# Patient Record
Sex: Female | Born: 1975 | Race: White | Hispanic: No | Smoking: Never smoker
Health system: Southern US, Community
[De-identification: ages and names within clinical notes are randomized; demographics above are authoritative.]

## PROBLEM LIST (undated history)

## (undated) DIAGNOSIS — E785 Hyperlipidemia, unspecified: Secondary | ICD-10-CM

## (undated) DIAGNOSIS — C801 Malignant (primary) neoplasm, unspecified: Secondary | ICD-10-CM

## (undated) DIAGNOSIS — I10 Essential (primary) hypertension: Secondary | ICD-10-CM

## (undated) HISTORY — PX: CERVIX SURGERY: SHX593

## (undated) HISTORY — DX: Hyperlipidemia, unspecified: E78.5

---

## 2020-08-16 ENCOUNTER — Inpatient Hospital Stay (HOSPITAL_COMMUNITY): Payer: Self-pay

## 2020-08-16 ENCOUNTER — Other Ambulatory Visit: Payer: Self-pay

## 2020-08-16 ENCOUNTER — Inpatient Hospital Stay (HOSPITAL_COMMUNITY)
Admission: EM | Admit: 2020-08-16 | Discharge: 2020-08-19 | DRG: 304 | Disposition: A | Discharge status: Home / Self Care | Payer: Self-pay | Attending: Internal Medicine | Admitting: Internal Medicine

## 2020-08-16 ENCOUNTER — Emergency Department (HOSPITAL_COMMUNITY): Payer: Self-pay

## 2020-08-16 ENCOUNTER — Encounter (HOSPITAL_COMMUNITY): Payer: Self-pay

## 2020-08-16 DIAGNOSIS — E876 Hypokalemia: Secondary | ICD-10-CM | POA: Diagnosis present

## 2020-08-16 DIAGNOSIS — Z6841 Body Mass Index (BMI) 40.0 and over, adult: Secondary | ICD-10-CM

## 2020-08-16 DIAGNOSIS — Z597 Insufficient social insurance and welfare support: Secondary | ICD-10-CM

## 2020-08-16 DIAGNOSIS — J45909 Unspecified asthma, uncomplicated: Secondary | ICD-10-CM | POA: Diagnosis present

## 2020-08-16 DIAGNOSIS — I110 Hypertensive heart disease with heart failure: Secondary | ICD-10-CM | POA: Diagnosis present

## 2020-08-16 DIAGNOSIS — Z20822 Contact with and (suspected) exposure to covid-19: Secondary | ICD-10-CM | POA: Diagnosis present

## 2020-08-16 DIAGNOSIS — I161 Hypertensive emergency: Secondary | ICD-10-CM

## 2020-08-16 DIAGNOSIS — Z79899 Other long term (current) drug therapy: Secondary | ICD-10-CM

## 2020-08-16 DIAGNOSIS — I160 Hypertensive urgency: Principal | ICD-10-CM | POA: Diagnosis present

## 2020-08-16 DIAGNOSIS — F419 Anxiety disorder, unspecified: Secondary | ICD-10-CM | POA: Diagnosis present

## 2020-08-16 DIAGNOSIS — R7989 Other specified abnormal findings of blood chemistry: Secondary | ICD-10-CM

## 2020-08-16 DIAGNOSIS — I5041 Acute combined systolic (congestive) and diastolic (congestive) heart failure: Secondary | ICD-10-CM | POA: Diagnosis present

## 2020-08-16 DIAGNOSIS — I429 Cardiomyopathy, unspecified: Secondary | ICD-10-CM

## 2020-08-16 DIAGNOSIS — I313 Pericardial effusion (noninflammatory): Secondary | ICD-10-CM | POA: Diagnosis present

## 2020-08-16 DIAGNOSIS — I3139 Other pericardial effusion (noninflammatory): Secondary | ICD-10-CM

## 2020-08-16 DIAGNOSIS — J189 Pneumonia, unspecified organism: Secondary | ICD-10-CM

## 2020-08-16 DIAGNOSIS — G43909 Migraine, unspecified, not intractable, without status migrainosus: Secondary | ICD-10-CM | POA: Diagnosis present

## 2020-08-16 DIAGNOSIS — R778 Other specified abnormalities of plasma proteins: Secondary | ICD-10-CM

## 2020-08-16 DIAGNOSIS — R0602 Shortness of breath: Secondary | ICD-10-CM

## 2020-08-16 DIAGNOSIS — R809 Proteinuria, unspecified: Secondary | ICD-10-CM | POA: Diagnosis present

## 2020-08-16 DIAGNOSIS — E785 Hyperlipidemia, unspecified: Secondary | ICD-10-CM | POA: Diagnosis present

## 2020-08-16 LAB — COMPREHENSIVE METABOLIC PANEL
ALT: 21 U/L (ref 0–44)
AST: 25 U/L (ref 15–41)
Albumin: 4.3 g/dL (ref 3.5–5.0)
Alkaline Phosphatase: 65 U/L (ref 38–126)
Anion gap: 12 (ref 5–15)
BUN: 14 mg/dL (ref 6–20)
CO2: 22 mmol/L (ref 22–32)
Calcium: 9.1 mg/dL (ref 8.9–10.3)
Chloride: 106 mmol/L (ref 98–111)
Creatinine, Ser: 1.11 mg/dL — ABNORMAL HIGH (ref 0.44–1.00)
GFR, Estimated: >60 mL/min (ref >60)
Glucose, Bld: 110 mg/dL — ABNORMAL HIGH (ref 70–99)
Potassium: 3.9 mmol/L (ref 3.5–5.1)
Sodium: 140 mmol/L (ref 135–145)
Total Bilirubin: 0.5 mg/dL (ref 0.3–1.2)
Total Protein: 7.8 g/dL (ref 6.5–8.1)

## 2020-08-16 LAB — CBC WITH DIFFERENTIAL/PLATELET
Abs Immature Granulocytes: 0.08 10*3/uL — ABNORMAL HIGH (ref 0.00–0.07)
Basophils Absolute: 0.1 10*3/uL (ref 0.0–0.1)
Basophils Relative: 1 %
Eosinophils Absolute: 0.2 10*3/uL (ref 0.0–0.5)
Eosinophils Relative: 1 %
HCT: 44.3 % (ref 36.0–46.0)
Hemoglobin: 14.5 g/dL (ref 12.0–15.0)
Immature Granulocytes: 1 %
Lymphocytes Relative: 9 %
Lymphs Abs: 1.4 10*3/uL (ref 0.7–4.0)
MCH: 28.6 pg (ref 26.0–34.0)
MCHC: 32.7 g/dL (ref 30.0–36.0)
MCV: 87.4 fL (ref 80.0–100.0)
Monocytes Absolute: 0.6 10*3/uL (ref 0.1–1.0)
Monocytes Relative: 4 %
Neutro Abs: 12.8 10*3/uL — ABNORMAL HIGH (ref 1.7–7.7)
Neutrophils Relative %: 84 %
Platelets: 259 10*3/uL (ref 150–400)
RBC: 5.07 MIL/uL (ref 3.87–5.11)
RDW: 14.4 % (ref 11.5–15.5)
WBC: 15.1 10*3/uL — ABNORMAL HIGH (ref 4.0–10.5)
nRBC: 0 % (ref 0.0–0.2)

## 2020-08-16 LAB — URINALYSIS, ROUTINE W REFLEX MICROSCOPIC
Bilirubin Urine: NEGATIVE
Glucose, UA: 50 mg/dL — AB
Ketones, ur: NEGATIVE mg/dL
Leukocytes,Ua: NEGATIVE
Nitrite: NEGATIVE
Protein, ur: 30 mg/dL — AB
RBC / HPF: >50 RBC/hpf — ABNORMAL HIGH (ref 0–5)
Specific Gravity, Urine: 1.005 (ref 1.005–1.030)
pH: 7 (ref 5.0–8.0)

## 2020-08-16 LAB — TROPONIN I (HIGH SENSITIVITY)
Troponin I (High Sensitivity): 33 ng/L — ABNORMAL HIGH (ref <18)
Troponin I (High Sensitivity): 28 ng/L — ABNORMAL HIGH (ref <18)

## 2020-08-16 LAB — PROCALCITONIN: Procalcitonin: <0.1 ng/mL

## 2020-08-16 LAB — RESP PANEL BY RT-PCR (FLU A&B, COVID) ARPGX2
Influenza A by PCR: NEGATIVE
Influenza B by PCR: NEGATIVE
SARS Coronavirus 2 by RT PCR: NEGATIVE

## 2020-08-16 LAB — HIV ANTIBODY (ROUTINE TESTING W REFLEX): HIV Screen 4th Generation wRfx: NONREACTIVE

## 2020-08-16 LAB — D-DIMER, QUANTITATIVE: D-Dimer, Quant: 0.64 ug/mL-FEU — ABNORMAL HIGH (ref 0.00–0.50)

## 2020-08-16 LAB — BRAIN NATRIURETIC PEPTIDE: B Natriuretic Peptide: 252.4 pg/mL — ABNORMAL HIGH (ref 0.0–100.0)

## 2020-08-16 MED ORDER — ALBUTEROL SULFATE HFA 108 (90 BASE) MCG/ACT IN AERS
8.0000 | INHALATION_SPRAY | Freq: Once | RESPIRATORY_TRACT | Status: AC
  Administered 2020-08-16: 07:00:00 8 via RESPIRATORY_TRACT
  Filled 2020-08-16: qty 6.7

## 2020-08-16 MED ORDER — ONDANSETRON HCL 4 MG/2ML IJ SOLN
4.0000 mg | Freq: Four times a day (QID) | INTRAMUSCULAR | Status: DC | PRN
  Administered 2020-08-16: 19:00:00 4 mg via INTRAVENOUS
  Filled 2020-08-16: qty 2

## 2020-08-16 MED ORDER — LOSARTAN POTASSIUM 50 MG PO TABS
50.0000 mg | ORAL_TABLET | Freq: Every day | ORAL | Status: DC
  Administered 2020-08-16: 11:00:00 50 mg via ORAL
  Filled 2020-08-16: qty 2

## 2020-08-16 MED ORDER — ALPRAZOLAM 0.5 MG PO TABS
0.5000 mg | ORAL_TABLET | Freq: Two times a day (BID) | ORAL | Status: DC | PRN
  Administered 2020-08-16 – 2020-08-17 (×3): 0.5 mg via ORAL
  Filled 2020-08-16 (×3): qty 1

## 2020-08-16 MED ORDER — LABETALOL HCL 5 MG/ML IV SOLN
10.0000 mg | INTRAVENOUS | Status: AC | PRN
  Administered 2020-08-16 (×3): 10 mg via INTRAVENOUS
  Filled 2020-08-16 (×2): qty 4

## 2020-08-16 MED ORDER — METOPROLOL TARTRATE 5 MG/5ML IV SOLN
5.0000 mg | Freq: Once | INTRAVENOUS | Status: AC
  Administered 2020-08-16: 15:00:00 5 mg via INTRAVENOUS
  Filled 2020-08-16: qty 5

## 2020-08-16 MED ORDER — ACETAMINOPHEN 650 MG RE SUPP: 650.0000 mg | Freq: Four times a day (QID) | RECTAL | Status: DC | PRN

## 2020-08-16 MED ORDER — AZITHROMYCIN 250 MG PO TABS
500.0000 mg | ORAL_TABLET | Freq: Every day | ORAL | Status: DC
  Administered 2020-08-16 – 2020-08-19 (×4): 500 mg via ORAL
  Filled 2020-08-16 (×4): qty 2

## 2020-08-16 MED ORDER — LORAZEPAM 2 MG/ML IJ SOLN
1.0000 mg | Freq: Once | INTRAMUSCULAR | Status: AC
  Administered 2020-08-16: 13:00:00 1 mg via INTRAVENOUS
  Filled 2020-08-16: qty 1

## 2020-08-16 MED ORDER — LOSARTAN POTASSIUM 50 MG PO TABS: 100.0000 mg | ORAL_TABLET | Freq: Every day | ORAL | Status: DC

## 2020-08-16 MED ORDER — ACETAMINOPHEN 325 MG PO TABS
650.0000 mg | ORAL_TABLET | Freq: Four times a day (QID) | ORAL | Status: DC | PRN
  Administered 2020-08-16 – 2020-08-17 (×3): 650 mg via ORAL
  Filled 2020-08-16 (×3): qty 2

## 2020-08-16 MED ORDER — HYDRALAZINE HCL 20 MG/ML IJ SOLN
10.0000 mg | Freq: Once | INTRAMUSCULAR | Status: AC
  Administered 2020-08-16: 11:00:00 10 mg via INTRAVENOUS
  Filled 2020-08-16: qty 1

## 2020-08-16 MED ORDER — METOPROLOL TARTRATE 25 MG PO TABS
25.0000 mg | ORAL_TABLET | Freq: Two times a day (BID) | ORAL | Status: DC
  Administered 2020-08-16: 13:00:00 25 mg via ORAL
  Filled 2020-08-16 (×2): qty 1

## 2020-08-16 MED ORDER — HYDRALAZINE HCL 50 MG PO TABS
50.0000 mg | ORAL_TABLET | Freq: Three times a day (TID) | ORAL | Status: DC
  Administered 2020-08-16 – 2020-08-17 (×4): 50 mg via ORAL
  Filled 2020-08-16 (×5): qty 1

## 2020-08-16 MED ORDER — LORAZEPAM 2 MG/ML IJ SOLN
0.5000 mg | Freq: Once | INTRAMUSCULAR | Status: AC
  Administered 2020-08-16: 07:00:00 0.5 mg via INTRAVENOUS
  Filled 2020-08-16: qty 1

## 2020-08-16 MED ORDER — LOSARTAN POTASSIUM 50 MG PO TABS
50.0000 mg | ORAL_TABLET | Freq: Every day | ORAL | Status: DC
  Administered 2020-08-17 – 2020-08-19 (×3): 50 mg via ORAL
  Filled 2020-08-16 (×3): qty 1

## 2020-08-16 MED ORDER — HYDRALAZINE HCL 20 MG/ML IJ SOLN
10.0000 mg | Freq: Three times a day (TID) | INTRAMUSCULAR | Status: DC | PRN
  Administered 2020-08-17 – 2020-08-18 (×2): 10 mg via INTRAVENOUS
  Filled 2020-08-16 (×2): qty 1

## 2020-08-16 MED ORDER — LOSARTAN POTASSIUM 50 MG PO TABS: 50.0000 mg | ORAL_TABLET | Freq: Once | ORAL | Status: DC

## 2020-08-16 MED ORDER — ONDANSETRON HCL 4 MG PO TABS: 4.0000 mg | ORAL_TABLET | Freq: Four times a day (QID) | ORAL | Status: DC | PRN

## 2020-08-16 MED ORDER — CEFTRIAXONE SODIUM 2 G IJ SOLR
2.0000 g | INTRAMUSCULAR | Status: DC
  Administered 2020-08-16 – 2020-08-17 (×2): 2 g via INTRAVENOUS
  Filled 2020-08-16: qty 20
  Filled 2020-08-16 (×2): qty 2

## 2020-08-16 MED ORDER — IOHEXOL 350 MG/ML SOLN
100.0000 mL | Freq: Once | INTRAVENOUS | Status: AC | PRN
  Administered 2020-08-16: 09:00:00 100 mL via INTRAVENOUS

## 2020-08-16 MED ORDER — PROCHLORPERAZINE EDISYLATE 10 MG/2ML IJ SOLN
10.0000 mg | Freq: Four times a day (QID) | INTRAMUSCULAR | Status: DC | PRN
  Administered 2020-08-16 – 2020-08-18 (×2): 10 mg via INTRAVENOUS
  Filled 2020-08-16 (×3): qty 2

## 2020-08-16 MED ORDER — METOPROLOL TARTRATE 50 MG PO TABS
50.0000 mg | ORAL_TABLET | Freq: Two times a day (BID) | ORAL | Status: DC
  Administered 2020-08-16 – 2020-08-17 (×3): 50 mg via ORAL
  Filled 2020-08-16 (×3): qty 1

## 2020-08-16 NOTE — ED Provider Notes (Signed)
Patient presents with cc of SOB.  Per previous provider note   "Patient without significant medical history, denies regular use of any daily medications, presents with onset of severe SOB that started 24 hours prior to arrival. No chest pain. She woke up early am yesterday morning with a feeling that she could not catch her breath and coughing. She reports her cough is sometimes persistent enough to cause her to gag but has had no vomiting. No known sick exposures. She reports remote use Albuterol "but not in years". She reports being COVID vaccinated. "    ? Pulmonary edema, no known hx of hypertension     Recheck on breathing, tests and imaging.   8:47 AM BP (!) 218/136   Pulse 100   Temp 98.3 F (36.8 C) (Oral)   Resp (!) 24   Ht 5\' 2"  (1.575 m)   Wt 97.5 kg   LMP  (LMP Unknown)   SpO2 99%   BMI 39.32 kg/m  I have reviewed the patient's labs.  CMP shows mildly elevated glucose and slightly elevated creatinine without previous baseline.  D-dimer is slightly elevated above normal.  CBC shows elevated white blood cell count at 15.1 without other abnormality.   Patient's Covid panel is negative, negative for flu.  She has an elevated troponin at 33.  Urinalysis shows large hemoglobin mild in the patient's urine.  She denies urinary symptoms.  On reevaluation of the patient I spoke with her and she states that she does not have 24 hours of symptom but awoke this morning with severe shortness of breath unlike anything she has had in the past.  She did take cold medication with decongestant this morning because she has had a dry cough that has been on and off for very long time and denies URI symptoms.  She denies any swelling in her lower extremities, orthopnea but feels severely short of breath and may have been having paroxysmal nocturnal dyspnea based on evaluation of the patient's images of her chest x-ray which I reviewed that show some likely mild interstitial edema.  Believe the  patient is currently having hypertensive emergency. I reviewed the patient's chart which shows that she was seen at [facility] for evaluation of headache in the emergency department on 07/08/2020 but eloped prior to being fully evaluated.  At that time her blood pressure was 225/151. I suppose the patient has been having hypertension for some time but is undiagnosed and does not follow regularly with medical care.  At this time we will try 3 doses of labetalol IV 10 mg every 10 minutes.  She is getting a CT angiogram of the chest to rule out pulmonary embolus as source of her shortness of breath.  We will reevaluate the patient's blood pressure but feel she will need admission.  Question whether she will need a drip for increased control of her blood pressure.  She denies headaches, neurologic symptoms or chest pain.   .Critical Care Performed by: 07/10/2020, PA-C Authorized by: Arthor Captain, PA-C   Critical care provider statement:    Critical care time (minutes):  50   Critical care time was exclusive of:  Separately billable procedures and treating other patients   Critical care was necessary to treat or prevent imminent or life-threatening deterioration of the following conditions: hypertensive emergency.   Critical care was time spent personally by me on the following activities:  Discussions with consultants, evaluation of patient's response to treatment, examination of patient, ordering and  performing treatments and interventions, ordering and review of laboratory studies, ordering and review of radiographic studies, pulse oximetry, re-evaluation of patient's condition, obtaining history from patient or surrogate and review of old charts   10:30 AM Case discussed with Dr. Marylyn Ishihara of [facility].  He will admit the patient for hypertensive emergency.  Patient has responded well to 3 doses of IV labetalol however he feels that it would be prudent to give her oral  medication has recommended Cozaar 50 mg which I have ordered.  Patient's second troponin is improved to some degree however the patient has no outpatient follow-up and has significant hypertension with endorgan damage requiring admission.  Patient is stable without chest pain shortness of breath or altered mentation throughout her visit in the ER.   Margarita Mail, PA-C 08/16/20 1031    Sherwood Gambler, MD 08/17/20 [phone]

## 2020-08-16 NOTE — ED Notes (Signed)
RN made aware of BP 

## 2020-08-16 NOTE — ED Triage Notes (Signed)
Pt states that she's had a cough for a few days but woke up this morning struggling to breathe

## 2020-08-16 NOTE — ED Notes (Signed)
Pt c/o intermittent cough, fever, and nasal congestion x 5 days.  Sts "I came in today because I stopped breathing while I was sleeping.  It woke me up."  Denies Hx of HTN.    While using BSC, Pt stated she has had urinary frequency and "fullness."

## 2020-08-16 NOTE — Progress Notes (Signed)
Pt given Metroprolol PO by ED RN-Dawn and pt arrive crying and coughing, stating my head hurts and I feel nauseated. Medicated with Xanax, tylendol, and Zofran, MD updated will recheck BP. SRP, RN

## 2020-08-16 NOTE — Progress Notes (Signed)
Pt also [place], protocol started. MD made aware and orders noted. SRP, RN

## 2020-08-16 NOTE — ED Provider Notes (Signed)
[facility] DEPT Provider Note   CSN: [id number] Arrival date & time: 08/16/20  0354     History No chief complaint on file.   Rebecca Donovan is a 44 y.o. female.  Patient without significant medical history, denies regular use of any daily medications, presents with onset of severe SOB that started 24 hours prior to arrival. No chest pain. She woke up early am yesterday morning with a feeling that she could not catch her breath and coughing. She reports her cough is sometimes persistent enough to cause her to gag but has had no vomiting. No known sick exposures. She reports remote use Albuterol "but not in years". She reports being COVID vaccinated.   The history is provided by the patient. No language interpreter was used.       History reviewed. No pertinent past medical history.  There are no problems to display for this patient.   History reviewed. No pertinent surgical history.   OB History   No obstetric history on file.     History reviewed. No pertinent family history.  Social History   Tobacco Use  . Smoking status: Never Smoker  . Smokeless tobacco: Never Used  Substance Use Topics  . Alcohol use: Never  . Drug use: Never    Home Medications Prior to Admission medications   Not on File    Allergies    Patient has no allergy information on record.  Review of Systems   Review of Systems  Constitutional: Positive for fever ("low grade"). Negative for chills.  HENT: Negative.   Respiratory: Positive for cough and shortness of breath.   Cardiovascular: Negative.  Negative for chest pain and leg swelling.  Gastrointestinal: Negative.  Negative for abdominal pain and vomiting.  Musculoskeletal: Negative.  Negative for myalgias.  Skin: Negative.   Neurological: Negative.     Physical Exam Updated Vital Signs BP (!) 198/140   Pulse (!) 103   Temp 98.3 F (36.8 C) (Oral)   Resp (!) 24   Ht 5\' 2"  (1.575 m)   Wt 97.5  kg   SpO2 96%   BMI 39.32 kg/m   Physical Exam Vitals and nursing note reviewed.  Constitutional:      Appearance: She is well-developed and well-nourished. She is obese.     Comments: Appears anxious.  HENT:     Head: Normocephalic.     Mouth/Throat:     Mouth: Mucous membranes are moist.  Cardiovascular:     Rate and Rhythm: Regular rhythm. Tachycardia present.     Heart sounds: No murmur heard.   Pulmonary:     Effort: Pulmonary effort is normal.     Breath sounds: Wheezing (R>L) present.  Abdominal:     General: Bowel sounds are normal.     Palpations: Abdomen is soft.     Tenderness: There is no abdominal tenderness. There is no guarding or rebound.  Musculoskeletal:        General: No swelling. Normal range of motion.     Cervical back: Normal range of motion and neck supple.     Right lower leg: No edema.     Left lower leg: No edema.  Skin:    General: Skin is warm and dry.     Findings: No rash.  Neurological:     Mental Status: She is alert and oriented to person, place, and time.  Psychiatric:        Mood and Affect: Mood and affect normal.  ED Results / Procedures / Treatments   Labs (all labs ordered are listed, but only abnormal results are displayed) Labs Reviewed  RESP PANEL BY RT-PCR (FLU A&B, COVID) [id number]  CBC WITH DIFFERENTIAL/PLATELET  COMPREHENSIVE METABOLIC PANEL  TROPONIN I (HIGH SENSITIVITY)    EKG EKG Interpretation  Date/Time:  Tuesday August 16 2020 04:15:19 EST Ventricular Rate:  109 PR Interval:    QRS Duration: 94 QT Interval:  357 QTC Calculation: 481 R Axis:   -35 Text Interpretation: Sinus tachycardia Left axis deviation Anterior infarct, old Abnormal T, consider ischemia, lateral leads 12 Lead; Mason-Likar Confirmed by Alona Bene [phone]) on 08/16/2020 4:58:25 AM   Radiology No results found.  Procedures Procedures (including critical care time)  Medications Ordered in ED Medications  albuterol (VENTOLIN  HFA) 108 (90 Base) MCG/ACT inhaler 8 puff (has no administration in time range)  LORazepam (ATIVAN) injection 0.5 mg (has no administration in time range)    ED Course  I have reviewed the triage vital signs and the nursing notes.  Pertinent labs & imaging results that were available during my care of the patient were reviewed by me and considered in my medical decision making (see chart for details).    MDM Rules/Calculators/A&P                          Patient to ED with SOB that woke her from sleep yesterday without chest pain. She reports low grade fever.   She is significantly hypertensive on arrival. She denies history of HTN. No chest pain. She is tachycardic, SOB of fairly sudden onset. D-dimer, troponin, EKG ordered but feel with symptoms of cough, wheezing, respiratory infection is more likely. She has been taking OTC decongestants, cough/cold formulas since yesterday which may contribute. She also appears significantly anxious. Ativan, Albuterol ordered.  Patient care signed out to Arthor Captain, [facility], for recheck and further management.   Final Clinical Impression(s) / ED Diagnoses Final diagnoses:  None   1. Dyspnea 2. Tachycardia 3. HTN  Rx / DC Orders ED Discharge Orders    None       Elpidio Anis, PA-C 08/16/20 0636    Maia Plan, MD 08/23/20 1209

## 2020-08-16 NOTE — H&P (Addendum)
History and Physical    Rebecca Donovan [id number] DOB: 1975-09-05 DOA: 08/16/2020  PCP: Patient, No Pcp Per  Patient coming from: Home  Chief Complaint: Dyspnea  HPI: Rebecca Donovan is a 44 y.o. female with medical history significant of HTN. Presenting with dyspnea. She reports that yesterday morning, she woke up with difficulty breathing. He chest was heavy. She thought it was asthma. She tried to take showers, some homeopathic oil, OTC decongestants/cold medicines, and a car drive to make things better. However, she got no relief. Her symptoms continued throughout the day into the night. She states that this morning, she stopped breathing while asleep. She became concerned and came to the ED. She denies any other aggravating or alleviating factors.     ED Course: CTA showed percardial effusion and possible multifocal PNA. No PE seen. Her BP was 228/129. She was given albuterol and labetalol She remained stable on RA (sats 100%). However, her BP was not controlled with labetolol. TRH was called for admission.   Review of Systems: Review of systems is otherwise negative for all not mentioned in HPI.   PMHx Asthma Migraines  PSHx ?Partial cervix removal?  SocHx  reports that she has never smoked. She has never used smokeless tobacco. She reports that she does not drink alcohol and does not use drugs.   FamHx History reviewed. No pertinent family history.  Prior to Admission medications   Not on File    Physical Exam: Vitals:   08/16/20 0919 08/16/20 0930 08/16/20 0936 08/16/20 1000  BP: (!) 199/131 (!) 199/124  (!) 198/136  Pulse: 91 90  87  Resp: (!) 22 (!) 23  (!) 22  Temp:   98.2 F (36.8 C)   TempSrc:      SpO2: 98% 99%  97%  Weight:      Height:        General: 44 y.o. female resting in bed, anxious Eyes: PERRL, normal sclera ENMT: Nares patent w/o discharge, orophaynx clear, dentition normal, ears w/o discharge/lesions/ulcers Neck: Supple, trachea  midline Cardiovascular: RRR, +S1, S2, no m/g/r, equal pulses throughout Respiratory: CTABL, no w/r/r, normal WOB on RA GI: BS+, NDNT, no masses noted, no organomegaly noted MSK: No e/c/c Skin: No rashes, bruises, ulcerations noted Neuro: A&O x 3, no focal deficits Psyc: Anxious, crying after CODE status questions, however she is cooperative  Labs on Admission: I have personally reviewed following labs and imaging studies  CBC: Recent Labs  Lab 08/16/20 0655  WBC 15.1*  NEUTROABS 12.8*  HGB 14.5  HCT 44.3  MCV 87.4  PLT 259   Basic Metabolic Panel: Recent Labs  Lab 08/16/20 0655  NA 140  K 3.9  CL 106  CO2 22  GLUCOSE 110*  BUN 14  CREATININE 1.11*  CALCIUM 9.1   GFR: Estimated Creatinine Clearance: 71.3 mL/min (A) (by C-G formula based on SCr of 1.11 mg/dL (H)). Liver Function Tests: Recent Labs  Lab 08/16/20 0655  AST 25  ALT 21  ALKPHOS 65  BILITOT 0.5  PROT 7.8  ALBUMIN 4.3   No results for input(s): LIPASE, AMYLASE in the last 168 hours. No results for input(s): AMMONIA in the last 168 hours. Coagulation Profile: No results for input(s): INR, PROTIME in the last 168 hours. Cardiac Enzymes: No results for input(s): CKTOTAL, CKMB, CKMBINDEX, TROPONINI in the last 168 hours. BNP (last 3 results) No results for input(s): PROBNP in the last 8760 hours. HbA1C: No results for input(s): HGBA1C in the last 72 hours.  CBG: No results for input(s): GLUCAP in the last 168 hours. Lipid Profile: No results for input(s): CHOL, HDL, LDLCALC, TRIG, CHOLHDL, LDLDIRECT in the last 72 hours. Thyroid Function Tests: No results for input(s): TSH, T4TOTAL, FREET4, T3FREE, THYROIDAB in the last 72 hours. Anemia Panel: No results for input(s): VITAMINB12, FOLATE, FERRITIN, TIBC, IRON, RETICCTPCT in the last 72 hours. Urine analysis:    Component Value Date/Time   COLORURINE STRAW (A) 08/16/2020 0655   APPEARANCEUR CLEAR 08/16/2020 0655   LABSPEC 1.005 08/16/2020 0655    PHURINE 7.0 08/16/2020 0655   GLUCOSEU 50 (A) 08/16/2020 0655   HGBUR LARGE (A) 08/16/2020 0655   BILIRUBINUR NEGATIVE 08/16/2020 0655   KETONESUR NEGATIVE 08/16/2020 0655   PROTEINUR 30 (A) 08/16/2020 0655   NITRITE NEGATIVE 08/16/2020 0655   LEUKOCYTESUR NEGATIVE 08/16/2020 0655    Radiological Exams on Admission: CT Angio Chest PE W and/or Wo Contrast  Result Date: 08/16/2020 CLINICAL DATA:  45 year old female with shortness of breath and fever. EXAM: CT ANGIOGRAPHY CHEST WITH CONTRAST TECHNIQUE: Multidetector CT imaging of the chest was performed using the standard protocol during bolus administration of intravenous contrast. Multiplanar CT image reconstructions and MIPs were obtained to evaluate the vascular anatomy. CONTRAST:  100 mL Omnipaque 350, intravenous COMPARISON:  None. FINDINGS: Cardiovascular: Satisfactory opacification of the pulmonary arteries to the segmental level. No evidence of pulmonary embolism. Moderate global cardiomegaly. Moderate pericardial effusion, measuring up to 1.4 cm along the lateral aspect of the right atrium. Mediastinum/Nodes: Scattered mediastinal prominent lymph nodes, for example paratracheal (CT series 5, image 27) which measures up to 1.3 cm in short axis. No hilar axillary lymph nodes. The thyroid gland, trachea, and esophagus demonstrate normal appearance. Lungs/Pleura: Fine parenchymal detail is limited by respiratory motion artifact. Mild diffuse mosaic attenuation pattern, likely secondary to expiratory phase image acquisition. Upper lobe predominant scattered bilateral ground-glass nodular opacities in a centrilobular distribution. No pleural effusion thorax. Upper Abdomen: The visualized upper abdomen is within normal limits. Musculoskeletal: No chest wall abnormality. No acute or significant osseous findings. Review of the MIP images confirms the above findings. IMPRESSION: Vascular: No evidence of pulmonary embolism. Non-Vascular: 1. Upper lobe  predominant diffuse ground-glass opacities in a centrilobular distribution, favored represent multifocal pneumonia in the setting of fever and cough. This is atypical presentation for multifocal pneumonia associated with COVID-19, however plausible. Scattered prominent mediastinal lymph nodes, likely reactive in the setting of infectious/inflammatory etiology. 2. Moderate pericardial effusion measuring up to 1.4 cm in thickness. 3. Moderate global cardiomegaly. Marliss Coots, MD [facility] Radiology Electronically Signed   By: Marliss Coots MD   On: 08/16/2020 09:33   DG Chest Portable 1 View  Result Date: 08/16/2020 CLINICAL DATA:  Shortness of breath.  Fever. EXAM: PORTABLE CHEST 1 VIEW COMPARISON:  No prior. FINDINGS: Mediastinum and hilar structures normal. Cardiomegaly. Low lung volumes with mild bibasilar atelectasis. Mild bilateral interstitial prominence. Mild interstitial edema and/or pneumonitis cannot be excluded. Mild elevation left hemidiaphragm. No prominent pleural effusion. No pneumothorax. IMPRESSION: 1. Cardiomegaly. 2. Low lung volumes with mild bibasilar atelectasis. Mild bilateral interstitial prominence. Mild interstitial edema and/or pneumonitis cannot be excluded. Electronically Signed   By: Maisie Fus  Register   On: 08/16/2020 06:33    EKG: Independently reviewed. Sinus tach, no st elevation  Assessment/Plan HTN urgency     - admit to inpatient, tele     - cozaar 50 mg, metoprolol 50mg  BID, schedule hydralazine 50 mg TID, PRN hydralazine     -  checking echo     - Her MAP at admission was 162, a 25% reduction would be about 120 (a BP of 180/90). This will be our goal for the first 24hrs.      - UA shows proteinuria, renal function is ok     - try to keep d/c meds on $4 list as patient does not have insurance     - will ask TOC to set up with PCP  Pericardial effusion Elevated Trp Elevated BNP     - CTA chest with evidence of  pericardial effusion     - check echo     - trp flat/downtrending     - BNP is mildly elevated; no history of HF  ?PNA     - CTA w/ concern for possible multifocal PNA     - she has had cough and dyspnea w/ elevated WBC     - add CAP coverage for now, check procal and Urine antigens  Anxiety     - PRN ativan     - needs PCP follow up before starting SSRI  ?Hx of asthma     - does not take inhalers at home     - satting 100% on RA and no wheeze noted     - PRN albuterol for wheeze   DVT prophylaxis: lovenox  Code Status: FULL  Family Communication: None at bedside  Consults called: None   Status is: Observation  The patient remains OBS appropriate and will d/c before 2 midnights.  Dispo: The patient is from: Home              Anticipated d/c is to: Home              Anticipated d/c date is: 1 day              Patient currently is not medically stable to d/c.  Jonnie Finner DO [facility]  If 7PM-7AM, please contact night-coverage [email or web address]  08/16/2020, 10:29 AM

## 2020-08-17 ENCOUNTER — Inpatient Hospital Stay (HOSPITAL_COMMUNITY): Payer: Self-pay

## 2020-08-17 DIAGNOSIS — J189 Pneumonia, unspecified organism: Secondary | ICD-10-CM

## 2020-08-17 DIAGNOSIS — I5021 Acute systolic (congestive) heart failure: Secondary | ICD-10-CM

## 2020-08-17 DIAGNOSIS — R0602 Shortness of breath: Secondary | ICD-10-CM

## 2020-08-17 DIAGNOSIS — R778 Other specified abnormalities of plasma proteins: Secondary | ICD-10-CM

## 2020-08-17 DIAGNOSIS — I160 Hypertensive urgency: Principal | ICD-10-CM

## 2020-08-17 DIAGNOSIS — R9431 Abnormal electrocardiogram [ECG] [EKG]: Secondary | ICD-10-CM

## 2020-08-17 DIAGNOSIS — I429 Cardiomyopathy, unspecified: Secondary | ICD-10-CM

## 2020-08-17 DIAGNOSIS — R7989 Other specified abnormal findings of blood chemistry: Secondary | ICD-10-CM

## 2020-08-17 DIAGNOSIS — I313 Pericardial effusion (noninflammatory): Secondary | ICD-10-CM

## 2020-08-17 DIAGNOSIS — I3139 Other pericardial effusion (noninflammatory): Secondary | ICD-10-CM

## 2020-08-17 DIAGNOSIS — I161 Hypertensive emergency: Secondary | ICD-10-CM

## 2020-08-17 LAB — RAPID URINE DRUG SCREEN, HOSP PERFORMED
Amphetamines: NOT DETECTED
Barbiturates: NOT DETECTED
Benzodiazepines: NOT DETECTED
Cocaine: NOT DETECTED
Opiates: NOT DETECTED
Tetrahydrocannabinol: POSITIVE — AB

## 2020-08-17 LAB — COMPREHENSIVE METABOLIC PANEL
ALT: 15 U/L (ref 0–44)
AST: 16 U/L (ref 15–41)
Albumin: 3.6 g/dL (ref 3.5–5.0)
Alkaline Phosphatase: 59 U/L (ref 38–126)
Anion gap: 12 (ref 5–15)
BUN: 15 mg/dL (ref 6–20)
CO2: 21 mmol/L — ABNORMAL LOW (ref 22–32)
Calcium: 8.7 mg/dL — ABNORMAL LOW (ref 8.9–10.3)
Chloride: 105 mmol/L (ref 98–111)
Creatinine, Ser: 1.15 mg/dL — ABNORMAL HIGH (ref 0.44–1.00)
GFR, Estimated: >60 mL/min (ref >60)
Glucose, Bld: 94 mg/dL (ref 70–99)
Potassium: 3.6 mmol/L (ref 3.5–5.1)
Sodium: 138 mmol/L (ref 135–145)
Total Bilirubin: 0.6 mg/dL (ref 0.3–1.2)
Total Protein: 6.4 g/dL — ABNORMAL LOW (ref 6.5–8.1)

## 2020-08-17 LAB — CBC
HCT: 39.9 % (ref 36.0–46.0)
Hemoglobin: 13 g/dL (ref 12.0–15.0)
MCH: 28.4 pg (ref 26.0–34.0)
MCHC: 32.6 g/dL (ref 30.0–36.0)
MCV: 87.3 fL (ref 80.0–100.0)
Platelets: 249 10*3/uL (ref 150–400)
RBC: 4.57 MIL/uL (ref 3.87–5.11)
RDW: 14.6 % (ref 11.5–15.5)
WBC: 11.8 10*3/uL — ABNORMAL HIGH (ref 4.0–10.5)
nRBC: 0 % (ref 0.0–0.2)

## 2020-08-17 LAB — STREP PNEUMONIAE URINARY ANTIGEN: Strep Pneumo Urinary Antigen: NEGATIVE

## 2020-08-17 LAB — ECHOCARDIOGRAM COMPLETE
Height: 62 in
S' Lateral: 4.1 cm
Weight: 3703.73 oz

## 2020-08-17 LAB — PROCALCITONIN: Procalcitonin: <0.1 ng/mL

## 2020-08-17 MED ORDER — FLUTICASONE PROPIONATE 50 MCG/ACT NA SUSP
2.0000 | Freq: Every day | NASAL | Status: DC
  Administered 2020-08-17 – 2020-08-19 (×3): 2 via NASAL
  Filled 2020-08-17: qty 16

## 2020-08-17 MED ORDER — ENOXAPARIN SODIUM 40 MG/0.4ML ~~LOC~~ SOLN
40.0000 mg | SUBCUTANEOUS | Status: DC
  Administered 2020-08-17 – 2020-08-19 (×3): 40 mg via SUBCUTANEOUS
  Filled 2020-08-17 (×3): qty 0.4

## 2020-08-17 MED ORDER — SPIRONOLACTONE 25 MG PO TABS
25.0000 mg | ORAL_TABLET | Freq: Every day | ORAL | Status: DC
  Administered 2020-08-18 – 2020-08-19 (×2): 25 mg via ORAL
  Filled 2020-08-17 (×2): qty 1

## 2020-08-17 MED ORDER — OXYCODONE HCL 5 MG PO TABS
5.0000 mg | ORAL_TABLET | ORAL | Status: DC | PRN
  Administered 2020-08-17 – 2020-08-19 (×7): 5 mg via ORAL
  Filled 2020-08-17 (×7): qty 1

## 2020-08-17 MED ORDER — FUROSEMIDE 10 MG/ML IJ SOLN
40.0000 mg | Freq: Once | INTRAMUSCULAR | Status: AC
  Administered 2020-08-17: 15:00:00 40 mg via INTRAVENOUS
  Filled 2020-08-17: qty 4

## 2020-08-17 NOTE — Progress Notes (Addendum)
[facility] PROGRESS NOTE   Rebecca Donovan NTZ:[id number] DOB: [birth year] DOA: 08/16/2020  PCP: Patient, No Pcp Per  Brief History/Interval Summary: 44 y.o. female with medical history significant of HTN.  Presented with shortness of breath ongoing for about a week or so.  Present even at rest.  No history suggestive of orthopnea or PND.  She did have some fever chills and cold-like symptoms a few days ago.  It has been several years since she last sought medical attention.  Due to presence of pneumonia pericardial effusion and significant symptoms as well as uncontrolled hypertension patient was hospitalized for further management  Reason for Visit: Community-acquired pneumonia  Consultants: None yet  Procedures: Echocardiogram is pending  Antibiotics: Anti-infectives (From admission, onward)   Start     Dose/Rate Route Frequency Ordered Stop   08/16/20 1600  cefTRIAXone (ROCEPHIN) 2 g in sodium chloride 0.9 % 100 mL IVPB        2 g 200 mL/hr over 30 Minutes Intravenous Every 24 hours 08/16/20 1419 08/21/20 1559   08/16/20 1515  azithromycin (ZITHROMAX) tablet 500 mg        500 mg Oral Daily 08/16/20 1419 08/21/20 0959      Subjective/Interval History: Patient mentions that she is feeling slightly better today compared to yesterday though still complains of headache at times.  Cough with clear expectoration.  No chest pain as mentioned.  No pleuritic symptoms present.  Some nausea and vomiting last night but none since then.    Assessment/Plan:  Hypertensive urgency Patient was started on multiple antihypertensives at admission including Cozaar, metoprolol and hydralazine.  Plan will be to decrease her blood pressure gradually.  She was initially noted to have blood pressure in the 675F systolic.  Seems to be better this morning.  Continue current medication regimen.  Avoid sudden drop in blood pressures.  Patient will likely need medication assistance at discharge as she  does not have insurance.  Community-acquired pneumonia CTA revealed multifocal pneumonia.  COVID-19 test as well as influenza PCR was negative.  Patient's WBC was noted to be elevated.  Patient started on ceftriaxone and azithromycin which will be continued for now.  Does not have any oxygen requirements currently.  Calcitonin noted to be surprisingly normal.  Pericardial effusion/mildly elevated troponin/abnormal EKG/cardiomegaly on imaging study EKG shows T inversion in the lateral leads.  Patient denies any chest pain.  Some of this could be due to uncontrolled hypertension.  No anginal symptoms currently.   Pericardial effusion noted on CT angiogram.  Patient denies any history of autoimmune disorders.  Will check ESR CRP ANA TSH.  No hemodynamic compromise noted. We will follow up on echocardiogram.  ADDENDUM Echocardiogram report reviewed.  EF noted to be diminished at 45 to 50%.  Global hypokinesis was noted.  Severe LVH was seen.  Grade 2 diastolic dysfunction was noted.  Patient will be given a dose of Lasix.  Discussed with Dr. Terri Skains with cardiology who will consult.  History of asthma Does not take inhalers at home.  No wheezing appreciated currently.  Anxiety Continue as needed anxiolytics.  Obesity Estimated body mass index is 42.34 kg/m as calculated from the following:   Height as of this encounter: _0  (1.575 m).   Weight as of this encounter: 105 kg.   DVT Prophylaxis: Lovenox Code Status: Full code Family Communication: Discussed with the patient Disposition Plan: Hopefully return home when improved  Status is: Inpatient  Remains inpatient appropriate because:IV treatments appropriate  due to intensity of illness or inability to take PO and Inpatient level of care appropriate due to severity of illness   Dispo: The patient is from: Home              Anticipated d/c is to: Home              Anticipated d/c date is: 2 days              Patient currently is not  medically stable to d/c.       Medications:  Scheduled: . azithromycin  500 mg Oral Daily  . hydrALAZINE  50 mg Oral Q8H  . losartan  50 mg Oral Daily  . metoprolol tartrate  50 mg Oral BID   Continuous: . cefTRIAXone (ROCEPHIN)  IV Stopped (08/16/20 1547)   RCV:[id number] **OR** acetaminophen, ALPRAZolam, hydrALAZINE, ondansetron **OR** ondansetron (ZOFRAN) IV, prochlorperazine   Objective:  Vital Signs  Vitals:   08/16/20 2025 08/16/20 2212 08/17/20 0130 08/17/20 0600  BP: (!) 182/111 (!) 153/76 135/73 (!) 177/101  Pulse: 90 93 79 82  Resp: _0 Temp: 98.3 F (36.8 C) 98 F (36.7 C) 98.1 F (36.7 C) 98.2 F (36.8 C)  TempSrc: Oral Oral Oral Oral  SpO2:  97% 97%   Weight:    105 kg  Height:        Intake/Output Summary (Last 24 hours) at 08/17/2020 1054 Last data filed at 08/16/2020 2200 Gross per 24 hour  Intake 340 ml  Output --  Net 340 ml   Filed Weights   08/16/20 0412 08/17/20 0600  Weight: 97.5 kg 105 kg    General appearance: Awake alert.  In no distress Resp: Normal effort at rest.  Few crackles bilateral bases.  No wheezing appreciated today.  No rhonchi. Cardio: S1-S2 is normal regular.  No S3-S4.  No rubs murmurs or bruit GI: Abdomen is soft.  Nontender nondistended.  Bowel sounds are present normal.  No masses organomegaly Extremities: No edema.  Full range of motion of lower extremities. Neurologic: Alert and oriented x3.  No focal neurological deficits.    Lab Results:  Data Reviewed: I have personally reviewed following labs and imaging studies  CBC: Recent Labs  Lab 08/16/20 0655 08/17/20 0359  WBC 15.1* 11.8*  NEUTROABS 12.8*  --   HGB 14.5 13.0  HCT 44.3 39.9  MCV 87.4 87.3  PLT 259 852    Basic Metabolic Panel: Recent Labs  Lab 08/16/20 0655 08/17/20 0359  NA 140 138  K 3.9 3.6  CL 106 105  CO2 22 21*  GLUCOSE 110* 94  BUN 14 15  CREATININE 1.11* 1.15*  CALCIUM 9.1 8.7*    GFR: Estimated  Creatinine Clearance: 71.8 mL/min (A) (by C-G formula based on SCr of 1.15 mg/dL (H)).  Liver Function Tests: Recent Labs  Lab 08/16/20 0655 08/17/20 0359  AST 25 16  ALT 21 15  ALKPHOS 65 59  BILITOT 0.5 0.6  PROT 7.8 6.4*  ALBUMIN 4.3 3.6     Recent Results (from the past 240 hour(s))  Resp Panel by RT-PCR (Flu A&B, Covid) Nasopharyngeal Swab     Status: None   Collection Time: 08/16/20  6:55 AM   Specimen: Nasopharyngeal Swab; Nasopharyngeal(NP) swabs in vial transport medium  Result Value Ref Range Status   SARS Coronavirus 2 by RT PCR NEGATIVE NEGATIVE Final    Comment: (NOTE) SARS-CoV-2 target nucleic acids are NOT DETECTED.  The SARS-CoV-2 RNA is  generally detectable in upper respiratory specimens during the acute phase of infection. The lowest concentration of SARS-CoV-2 viral copies this assay can detect is 138 copies/mL. A negative result does not preclude SARS-Cov-2 infection and should not be used as the sole basis for treatment or other patient management decisions. A negative result may occur with  improper specimen collection/handling, submission of specimen other than nasopharyngeal swab, presence of viral mutation(s) within the areas targeted by this assay, and inadequate number of viral copies(<138 copies/mL). A negative result must be combined with clinical observations, patient history, and epidemiological information. The expected result is Negative.  Fact Sheet for Patients:  EntrepreneurPulse.com.au  Fact Sheet for Healthcare Providers:  IncredibleEmployment.be  This test is no t yet approved or cleared by the [place] FDA and  has been authorized for detection and/or diagnosis of SARS-CoV-2 by FDA under an Emergency Use Authorization (EUA). This EUA will remain  in effect (meaning this test can be used) for the duration of the COVID-19 declaration under Section 564(b)(1) of the Act, 21 U.S.C.section  360bbb-3(b)(1), unless the authorization is terminated  or revoked sooner.       Influenza A by PCR NEGATIVE NEGATIVE Final   Influenza B by PCR NEGATIVE NEGATIVE Final    Comment: (NOTE) The Xpert Xpress SARS-CoV-2/FLU/RSV plus assay is intended as an aid in the diagnosis of influenza from Nasopharyngeal swab specimens and should not be used as a sole basis for treatment. Nasal washings and aspirates are unacceptable for Xpert Xpress SARS-CoV-2/FLU/RSV testing.  Fact Sheet for Patients: EntrepreneurPulse.com.au  Fact Sheet for Healthcare Providers: IncredibleEmployment.be  This test is not yet approved or cleared by the [place] FDA and has been authorized for detection and/or diagnosis of SARS-CoV-2 by FDA under an Emergency Use Authorization (EUA). This EUA will remain in effect (meaning this test can be used) for the duration of the COVID-19 declaration under Section 564(b)(1) of the Act, 21 U.S.C. section 360bbb-3(b)(1), unless the authorization is terminated or revoked.  Performed at [facility], [place] [address] [place], [place] [address]       Radiology Studies: CT Angio Chest PE W and/or Wo Contrast  Result Date: 08/16/2020 CLINICAL DATA:  44 year old female with shortness of breath and fever. EXAM: CT ANGIOGRAPHY CHEST WITH CONTRAST TECHNIQUE: Multidetector CT imaging of the chest was performed using the standard protocol during bolus administration of intravenous contrast. Multiplanar CT image reconstructions and MIPs were obtained to evaluate the vascular anatomy. CONTRAST:  100 mL Omnipaque 350, intravenous COMPARISON:  None. FINDINGS: Cardiovascular: Satisfactory opacification of the pulmonary arteries to the segmental level. No evidence of pulmonary embolism. Moderate global cardiomegaly. Moderate pericardial effusion, measuring up to 1.4 cm along the lateral aspect of the right atrium. Mediastinum/Nodes:  Scattered mediastinal prominent lymph nodes, for example paratracheal (CT series 5, image 27) which measures up to 1.3 cm in short axis. No hilar axillary lymph nodes. The thyroid gland, trachea, and esophagus demonstrate normal appearance. Lungs/Pleura: Fine parenchymal detail is limited by respiratory motion artifact. Mild diffuse mosaic attenuation pattern, likely secondary to expiratory phase image acquisition. Upper lobe predominant scattered bilateral ground-glass nodular opacities in a centrilobular distribution. No pleural effusion thorax. Upper Abdomen: The visualized upper abdomen is within normal limits. Musculoskeletal: No chest wall abnormality. No acute or significant osseous findings. Review of the MIP images confirms the above findings. IMPRESSION: Vascular: No evidence of pulmonary embolism. Non-Vascular: 1. Upper lobe predominant diffuse ground-glass opacities in a centrilobular distribution, favored represent multifocal pneumonia  in the setting of fever and cough. This is atypical presentation for multifocal pneumonia associated with COVID-19, however plausible. Scattered prominent mediastinal lymph nodes, likely reactive in the setting of infectious/inflammatory etiology. 2. Moderate pericardial effusion measuring up to 1.4 cm in thickness. 3. Moderate global cardiomegaly. Ruthann Cancer, MD [facility] Radiology Electronically Signed   By: Ruthann Cancer MD   On: 08/16/2020 09:33   DG Chest Portable 1 View  Result Date: 08/16/2020 CLINICAL DATA:  Shortness of breath.  Fever. EXAM: PORTABLE CHEST 1 VIEW COMPARISON:  No prior. FINDINGS: Mediastinum and hilar structures normal. Cardiomegaly. Low lung volumes with mild bibasilar atelectasis. Mild bilateral interstitial prominence. Mild interstitial edema and/or pneumonitis cannot be excluded. Mild elevation left hemidiaphragm. No prominent pleural effusion. No pneumothorax. IMPRESSION: 1.  Cardiomegaly. 2. Low lung volumes with mild bibasilar atelectasis. Mild bilateral interstitial prominence. Mild interstitial edema and/or pneumonitis cannot be excluded. Electronically Signed   By: Marcello Moores  Register   On: 08/16/2020 06:33       LOS: 1 day   [facility] Pager on [email or web address]  08/17/2020, 10:54 AM

## 2020-08-17 NOTE — Plan of Care (Signed)

## 2020-08-17 NOTE — Plan of Care (Signed)
  Problem: Education: Goal: Knowledge of General Education information will improve Description: Including pain rating scale, medication(s)/side effects and non-pharmacologic comfort measures Outcome: Progressing   Problem: Health Behavior/Discharge Planning: Goal: Ability to manage health-related needs will improve Outcome: Progressing   Problem: Clinical Measurements: Goal: Ability to maintain clinical measurements within normal limits will improve Outcome: Progressing Goal: Diagnostic test results will improve Outcome: Progressing Goal: Cardiovascular complication will be avoided Outcome: Progressing   Problem: Coping: Goal: Level of anxiety will decrease Outcome: Progressing   Problem: Pain Managment: Goal: General experience of comfort will improve Outcome: Progressing   

## 2020-08-17 NOTE — Consult Note (Signed)
CARDIOLOGY CONSULT NOTE  Patient ID: Rebecca Donovan MRN: [id number] DOB/AGE: Jun 10, 1976 44 y.o.  Admit date: 08/16/2020 Attending physician: Bonnielee Haff, MD Primary Physician:  Patient, No Pcp Per Outpatient Cardiologist: None Inpatient Cardiologist: Rex Kras, DO, [facility]  Chief complaint: Shortness of breath Reason of consultation: Cardiomyopathy.  HPI:  Rebecca Donovan is a 44 y.o. Caucasian female who presents with a chief complaint of " shortness of breath." Her past medical history and cardiovascular risk factors include: Asthma, Migraines, obesity due to excess calories.  Patient presented to the hospital with complaint of shortness of breath and difficulty breathing going on for 24-48 hours prior to ER arrival.  Patient states that she came to the hospital because she could no longer oriented.  She is having upper respiratory tract like symptoms such as fevers, stuffy nose, productive cough.  She was trying home remedies such as cold and cough syrups, Alka-Seltzer, NyQuil without any significant relief.  Patient was found to be having hypertensive emergency on presentation with SBP >235mHg  With proteinuria on UA, elevated BNP, high sensitive troponins elevated above upper limits of normal level but overall flat, shortness of breath. Chest x-ray concerning for mild interstitial edema.   The setting of elevated D-dimers and shortness of breath patient underwent CT of the chest which is negative for pulmonary embolism but concerning the multifocal pneumonia. She was also noted to have moderate cardiomegaly with moderate pericardial effusion along the lateral aspect of the right atrium per CT.  Subsequently patient underwent an echocardiogram which notes mildly reduced left ventricular systolic function (LVEF 45 to 50%), with global hypokinesis, grade 2 diastolic impairment, elevated left atrial pressure, severe left ventricular hypertrophy.  Cardiology was consulted during his  hospitalization presents with a cardiomyopathy.  Patient denies any prior history of cardiomyopathy.  She denies any family history of premature CAD, cardiomyopathy, or sudden cardiac death.  Patient denies use of recreational drugs except marijuana Gummies.  She denies the use of stimulant medications.  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  Since admission patient's blood pressure has improved since admission patient's blood pressure has improved.  Currently being managed by primary team.  Symptomatically her shortness of breath is improved by at least 60% according to the patient.  She is overall euvolemic and not in congestive heart failure.  She denies any active chest pain or angina pectoris prior to hospitalization.  ALLERGIES: No Known Allergies  PAST MEDICAL HISTORY: Asthma, migraines  PAST SURGICAL HISTORY: Partial cervix removed in year 2002  FAMILY HISTORY: No family history of premature coronary disease or sudden cardiac death   SOCIAL HISTORY:  The patient  reports that she has never smoked. She has never used smokeless tobacco. She reports that she does not drink alcohol and does not use drugs. marijuana Gummies  MEDICATIONS: Current Outpatient Medications  Medication Instructions  . aspirin-acetaminophen-caffeine (EXCEDRIN MIGRAINE) 250-250-65 MG tablet 2 tablets, Oral, Every 6 hours PRN  . Aspirin-Acetaminophen-Caffeine (GOODY HEADACHE PO) 1 Package, Oral, As needed  . Homeopathic Products (ARNICARE ARNICA) CREA 1 application, Apply externally, Daily PRN  . OVER THE COUNTER MEDICATION 1 patch, Transdermal, Daily, Innovative multi vitamin patch    Review of Systems  Constitutional: Positive for fever (prior to admission). Negative for chills.  HENT: Negative for hoarse voice and nosebleeds.   Eyes: Negative for discharge, double vision and pain.  Cardiovascular: Positive for dyspnea on exertion  (improving). Negative for chest pain, claudication, leg swelling,  near-syncope, orthopnea, palpitations, paroxysmal nocturnal dyspnea and syncope.  Respiratory: Positive for cough (productive cough) and shortness of breath (improving). Negative for hemoptysis.   Musculoskeletal: Negative for muscle cramps and myalgias.  Gastrointestinal: Negative for abdominal pain, constipation, diarrhea, hematemesis, hematochezia, melena, nausea and vomiting.  Neurological: Negative for dizziness and light-headedness.  All other systems reviewed and are negative.   PHYSICAL EXAM: Vitals with BMI 08/17/2020 08/17/2020 08/17/2020  Height - - -  Weight - - 231 lbs 8 oz  BMI - - 38.93  Systolic 734 287 681  Diastolic 157 262 035  Pulse 90 81 82     Intake/Output Summary (Last 24 hours) at 08/17/2020 2144 Last data filed at 08/16/2020 2200 Gross per 24 hour  Intake 240 ml  Output --  Net 240 ml    Net IO Since Admission: 340 mL [08/17/20 2144]  CONSTITUTIONAL: Appears older than stated age, well-developed and well-nourished. No acute distress.  SKIN: Skin is warm and dry. No rash noted. No cyanosis. No pallor. No jaundice HEAD: Normocephalic and atraumatic.  EYES: No scleral icterus MOUTH/THROAT: Moist oral membranes.  NECK: No JVD present. No thyromegaly noted. No carotid bruits  LYMPHATIC: No visible cervical adenopathy.  CHEST Normal respiratory effort. No intercostal retractions  LUNGS: Mild expiratory wheezes and rales noted at the bases CARDIOVASCULAR: Regular, positive S1-S2, no murmurs rubs or gallops appreciated. ABDOMINAL: Obese, soft, nontender, distended, positive bowel sounds in all 4 quadrants, no apparent ascites.  EXTREMITIES: No peripheral edema  HEMATOLOGIC: No significant bruising NEUROLOGIC: Oriented to person, place, and time. Nonfocal. Normal muscle tone.  PSYCHIATRIC: Normal mood and affect. Normal behavior. Cooperative  RADIOLOGY: CT Angio Chest PE W and/or Wo  Contrast  Result Date: 08/16/2020 CLINICAL DATA:  44 year old female with shortness of breath and fever. EXAM: CT ANGIOGRAPHY CHEST WITH CONTRAST TECHNIQUE: Multidetector CT imaging of the chest was performed using the standard protocol during bolus administration of intravenous contrast. Multiplanar CT image reconstructions and MIPs were obtained to evaluate the vascular anatomy. CONTRAST:  100 mL Omnipaque 350, intravenous COMPARISON:  None. FINDINGS: Cardiovascular: Satisfactory opacification of the pulmonary arteries to the segmental level. No evidence of pulmonary embolism. Moderate global cardiomegaly. Moderate pericardial effusion, measuring up to 1.4 cm along the lateral aspect of the right atrium. Mediastinum/Nodes: Scattered mediastinal prominent lymph nodes, for example paratracheal (CT series 5, image 27) which measures up to 1.3 cm in short axis. No hilar axillary lymph nodes. The thyroid gland, trachea, and esophagus demonstrate normal appearance. Lungs/Pleura: Fine parenchymal detail is limited by respiratory motion artifact. Mild diffuse mosaic attenuation pattern, likely secondary to expiratory phase image acquisition. Upper lobe predominant scattered bilateral ground-glass nodular opacities in a centrilobular distribution. No pleural effusion thorax. Upper Abdomen: The visualized upper abdomen is within normal limits. Musculoskeletal: No chest wall abnormality. No acute or significant osseous findings. Review of the MIP images confirms the above findings. IMPRESSION: Vascular: No evidence of pulmonary embolism. Non-Vascular: 1. Upper lobe predominant diffuse ground-glass opacities in a centrilobular distribution, favored represent multifocal pneumonia in the setting of fever and cough. This is atypical presentation for multifocal pneumonia associated with COVID-19, however plausible. Scattered prominent mediastinal lymph nodes, likely reactive in the setting of infectious/inflammatory etiology.  2. Moderate pericardial effusion measuring up to 1.4 cm in thickness. 3. Moderate global cardiomegaly. Ruthann Cancer, MD [facility] Electronically Signed   By: Ruthann Cancer MD   On: 08/16/2020 09:33   DG Chest Portable 1 View  Result  Date: 08/16/2020 CLINICAL DATA:  Shortness of breath.  Fever. EXAM: PORTABLE CHEST 1 VIEW COMPARISON:  No prior. FINDINGS: Mediastinum and hilar structures normal. Cardiomegaly. Low lung volumes with mild bibasilar atelectasis. Mild bilateral interstitial prominence. Mild interstitial edema and/or pneumonitis cannot be excluded. Mild elevation left hemidiaphragm. No prominent pleural effusion. No pneumothorax. IMPRESSION: 1. Cardiomegaly. 2. Low lung volumes with mild bibasilar atelectasis. Mild bilateral interstitial prominence. Mild interstitial edema and/or pneumonitis cannot be excluded. Electronically Signed   By: Marcello Moores  Register   On: 08/16/2020 06:33   ECHOCARDIOGRAM COMPLETE  Result Date: 08/17/2020    ECHOCARDIOGRAM REPORT   Patient Name:   MORGAINE KIMBALL Date of Exam: 08/16/2020 Medical Rec #:  [id number]   Height:       62.0 in Accession #:    [id number]  Weight:       231.5 lb Date of Birth:  Jan 27, 1976   BSA:          2.034 m Patient Age:    63 years    BP:           177/101 mmHg Patient Gender: F           HR:           83 bpm. Exam Location:  Inpatient Procedure: 2D Echo, Color Doppler and Cardiac Doppler Indications:    D74.12 Acute systolic (congestive) heart failure  History:        Patient has no prior history of Echocardiogram examinations.                 Risk Factors:Hypertension.  Sonographer:    Raquel Sarna Senior RDCS Referring Phys: [id number] [place]  1. Left ventricular ejection fraction, by estimation, is 45 to 50%. The left ventricle has mildly decreased function. The left ventricle demonstrates global hypokinesis. The left ventricular internal cavity size was mildly dilated. There  is severe left ventricular hypertrophy. Left ventricular diastolic parameters are consistent with Grade II diastolic dysfunction (pseudonormalization). Elevated left atrial pressure.  2. Right ventricular systolic function is normal. The right ventricular size is normal. Tricuspid regurgitation signal is inadequate for assessing PA pressure.  3. A small pericardial effusion is present.  4. The mitral valve is normal in structure. No evidence of mitral valve regurgitation. No evidence of mitral stenosis.  5. The aortic valve was not well visualized. Aortic valve regurgitation is not visualized.  6. The inferior vena cava is dilated in size with >50% respiratory variability, suggesting right atrial pressure of 8 mmHg. FINDINGS  Left Ventricle: Left ventricular ejection fraction, by estimation, is 45 to 50%. The left ventricle has mildly decreased function. The left ventricle demonstrates global hypokinesis. The left ventricular internal cavity size was mildly dilated. There is  severe left ventricular hypertrophy. Left ventricular diastolic parameters are consistent with Grade II diastolic dysfunction (pseudonormalization). Elevated left atrial pressure. Right Ventricle: The right ventricular size is normal. No increase in right ventricular wall thickness. Right ventricular systolic function is normal. Tricuspid regurgitation signal is inadequate for assessing PA pressure. Left Atrium: Left atrial size was normal in size. Right Atrium: Right atrial size was normal in size. Pericardium: A small pericardial effusion is present. Mitral Valve: The mitral valve is normal in structure. No evidence of mitral valve regurgitation. No evidence of mitral valve stenosis. Tricuspid Valve: The tricuspid valve is normal in structure. Tricuspid valve regurgitation is not demonstrated. Aortic Valve: The aortic valve was not well visualized. Aortic valve regurgitation is not visualized. Pulmonic Valve: The  pulmonic valve was not well  visualized. Pulmonic valve regurgitation is not visualized. Aorta: The aortic root and ascending aorta are structurally normal, with no evidence of dilitation. Venous: The inferior vena cava is dilated in size with greater than 50% respiratory variability, suggesting right atrial pressure of 8 mmHg. IAS/Shunts: The interatrial septum was not well visualized.  LEFT VENTRICLE PLAX 2D LVIDd:         5.40 cm  Diastology LVIDs:         4.10 cm  LV e' medial:  4.03 cm/s LV PW:         1.80 cm  LV e' lateral: 4.03 cm/s LV IVS:        1.20 cm LVOT diam:     2.00 cm LV SV:         50 LV SV Index:   25 LVOT Area:     3.14 cm  RIGHT VENTRICLE RV S prime:     11.90 cm/s TAPSE (M-mode): 2.0 cm LEFT ATRIUM             Index       RIGHT ATRIUM           Index LA diam:        3.70 cm 1.82 cm/m  RA Area:     16.10 cm LA Vol (A2C):   65.9 ml 32.40 ml/m RA Volume:   39.00 ml  19.17 ml/m LA Vol (A4C):   63.4 ml 31.17 ml/m LA Biplane Vol: 65.6 ml 32.25 ml/m  AORTIC VALVE LVOT Vmax:   77.00 cm/s LVOT Vmean:  55.900 cm/s LVOT VTI:    0.159 m  AORTA Ao Root diam: 3.00 cm Ao Asc diam:  3.40 cm  SHUNTS Systemic VTI:  0.16 m Systemic Diam: 2.00 cm Oswaldo Milian MD Electronically signed by Oswaldo Milian MD Signature Date/Time: 08/17/2020/12:29:50 PM    Final     LABORATORY DATA: Lab Results  Component Value Date   WBC 11.8 (H) 08/17/2020   HGB 13.0 08/17/2020   HCT 39.9 08/17/2020   MCV 87.3 08/17/2020   PLT 249 08/17/2020    Recent Labs  Lab 08/17/20 0359  NA 138  K 3.6  CL 105  CO2 21*  BUN 15  CREATININE 1.15*  CALCIUM 8.7*  PROT 6.4*  BILITOT 0.6  ALKPHOS 59  ALT 15  AST 16  GLUCOSE 94    Lipid Panel  No results found for: CHOL, TRIG, HDL, CHOLHDL, VLDL, LDLCALC  BNP (last 3 results) Recent Labs    08/16/20 0655  BNP 252.4*    HEMOGLOBIN A1C No results found for: HGBA1C, MPG  Cardiac Panel (last 3 results) Recent Labs    08/16/20 0655 08/16/20 0814  TROPONINIHS 33* 28*     TSH No results for input(s): TSH in the last 8760 hours.   CARDIAC DATABASE: EKG: 08/17/2020: Normal sinus rhythm, 81 bpm, left axis deviation, poor R wave progression, LVH, ST-T changes in the high lateral and lateral leads possibly secondary to LVH but underlying ischemia cannot be ruled out.  Echocardiogram: 08/17/2020: 1. Left ventricular ejection fraction, by estimation, is 45 to 50%. The  left ventricle has mildly decreased function. The left ventricle  demonstrates global hypokinesis. The left ventricular internal cavity size  was mildly dilated. There is severe left  ventricular hypertrophy. Left ventricular diastolic parameters are  consistent with Grade II diastolic dysfunction (pseudonormalization).  Elevated left atrial pressure.  2. Right ventricular systolic function is normal. The right ventricular  size is normal. Tricuspid regurgitation signal is inadequate for assessing  PA pressure.  3. A small pericardial effusion is present.  4. The mitral valve is normal in structure. No evidence of mitral valve  regurgitation. No evidence of mitral stenosis.  5. The aortic valve was not well visualized. Aortic valve regurgitation  is not visualized.  6. The inferior vena cava is dilated in size with >50% respiratory  variability, suggesting right atrial pressure of 8 mmHg.   IMPRESSION & RECOMMENDATIONS: Rebecca Donovan is a 44 y.o. Caucasian female whose past medical history and cardiovascular risk factors include: Asthma, Migraines, obesity due to excess calories.  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  Recommend transitioning the Lopressor to carvedilol 25 mg p.o. twice daily.  Recommend up titration of losartan as hemodynamics and laboratory values allow.  We will add spironolactone 25 mg p.o. daily in the setting of mildly reduced LVEF, elevated BNP, grade 2 diastolic  impairment and elevated LAP on echo.  Continue to uptitrate guideline directed medical therapy as hemodynamics and laboratory values allow.  Check UDS.  Check TSH.  Check fasting lipid profile.  Check a1c  Echo results reviewed.  Educated on importance of blood pressure management.  Patient states that she was never told that she has high blood pressure; however, I suspect that she has had longstanding hypertension that has been untreated.  She has severe LVH on echocardiogram and when she went to [facility] back in November 2021 ER notes mention blood pressure of 225/151.  Elevated troponins most likely secondary to type II MI supply demand ischemia in the setting of hypertensive emergency and presentation.  High sensitive troponins were relatively flat.  Patient not have any chest pain prior to admission or during today's evaluation.  EKG shows normal sinus rhythm with ST-T changes in the high lateral and lateral leads which may be secondary to LVH but ischemia cannot be rule out.  No prior EKGs to review as per our EMR or records that are available on Care Everywhere.   Patient would like to proceed with an ischemic evaluation as outpatient.  We will rediscuss this tomorrow.  Check EKG in the morning.  Hypertensive emergency on admission: Improving.   SBP >249mHg on admission, with proteinuria on UA, elevated BNP, high sensitive troponins elevated above upper limits of normal level but overall flat, shortness of breath. Chest x-ray concerning for mild interstitial edema.   Currently managed by primary team.  Check urine drug screen  Pericardial effusion  Small in size on recent echocardiogram.  Agree with checking CRP, ESR, and ANA.  Patient is currently being treated for community-acquired pneumonia.    Covid screen negative on admission  Community acquired pneumonia: Currently managed by primary team  Continue your care regarding her other  chronic comorbid conditions.  Patient is encouraged to follow-up outpatient once discharged from the hospital.   Total encounter time 87 minutes. *Total Encounter Time as defined by the Centers for Medicare and Medicaid Services includes, in addition to the face-to-face time of a patient visit (documented in the note above) non-face-to-face time: obtaining and reviewing outside history, ordering and reviewing medications, tests or procedures, reviewing records from [facility] care coordination (communications with other health care professionals or caregivers) and documentation in the medical record.  Patient's questions and concerns were addressed to her satisfaction. She voices understanding of the instructions provided during this encounter.   This note  was created using a voice recognition software as a result there may be grammatical errors inadvertently enclosed that do not reflect the nature of this encounter. Every attempt is made to correct such errors.  Mechele Claude [facility]  Pager: [phone] Office: [phone] 08/17/2020, 9:44 PM

## 2020-08-17 NOTE — Plan of Care (Signed)
Severity of pain is diminishing

## 2020-08-17 NOTE — Progress Notes (Signed)
Echocardiogram 2D Echocardiogram has been performed.  Rebecca Donovan 08/17/2020, 10:35 AM

## 2020-08-18 DIAGNOSIS — I429 Cardiomyopathy, unspecified: Secondary | ICD-10-CM

## 2020-08-18 LAB — BASIC METABOLIC PANEL
Anion gap: 10 (ref 5–15)
BUN: 20 mg/dL (ref 6–20)
CO2: 24 mmol/L (ref 22–32)
Calcium: 8.9 mg/dL (ref 8.9–10.3)
Chloride: 104 mmol/L (ref 98–111)
Creatinine, Ser: 1.27 mg/dL — ABNORMAL HIGH (ref 0.44–1.00)
GFR, Estimated: 54 mL/min — ABNORMAL LOW (ref >60)
Glucose, Bld: 110 mg/dL — ABNORMAL HIGH (ref 70–99)
Potassium: 3.4 mmol/L — ABNORMAL LOW (ref 3.5–5.1)
Sodium: 138 mmol/L (ref 135–145)

## 2020-08-18 LAB — LEGIONELLA PNEUMOPHILA SEROGP 1 UR AG: L. pneumophila Serogp 1 Ur Ag: NEGATIVE

## 2020-08-18 LAB — LIPID PANEL
Cholesterol: 216 mg/dL — ABNORMAL HIGH (ref 0–200)
HDL: 46 mg/dL (ref >40)
LDL Cholesterol: 136 mg/dL — ABNORMAL HIGH (ref 0–99)
Total CHOL/HDL Ratio: 4.7 RATIO
Triglycerides: 168 mg/dL — ABNORMAL HIGH (ref <150)
VLDL: 34 mg/dL (ref 0–40)

## 2020-08-18 LAB — CBC
HCT: 44.1 % (ref 36.0–46.0)
Hemoglobin: 14.1 g/dL (ref 12.0–15.0)
MCH: 28.1 pg (ref 26.0–34.0)
MCHC: 32 g/dL (ref 30.0–36.0)
MCV: 88 fL (ref 80.0–100.0)
Platelets: 255 10*3/uL (ref 150–400)
RBC: 5.01 MIL/uL (ref 3.87–5.11)
RDW: 14.6 % (ref 11.5–15.5)
WBC: 10.1 10*3/uL (ref 4.0–10.5)
nRBC: 0 % (ref 0.0–0.2)

## 2020-08-18 LAB — HEMOGLOBIN A1C
Hgb A1c MFr Bld: 5.1 % (ref 4.8–5.6)
Mean Plasma Glucose: 99.67 mg/dL

## 2020-08-18 LAB — PROCALCITONIN: Procalcitonin: <0.1 ng/mL

## 2020-08-18 LAB — TSH: TSH: 5.218 u[IU]/mL — ABNORMAL HIGH (ref 0.350–4.500)

## 2020-08-18 LAB — SEDIMENTATION RATE: Sed Rate: 4 mm/hr (ref 0–22)

## 2020-08-18 LAB — C-REACTIVE PROTEIN: CRP: 0.6 mg/dL (ref <1.0)

## 2020-08-18 MED ORDER — CARVEDILOL 25 MG PO TABS
25.0000 mg | ORAL_TABLET | Freq: Two times a day (BID) | ORAL | Status: DC
  Administered 2020-08-18 – 2020-08-19 (×3): 25 mg via ORAL
  Filled 2020-08-18 (×3): qty 1

## 2020-08-18 MED ORDER — HYDRALAZINE HCL 50 MG PO TABS
100.0000 mg | ORAL_TABLET | Freq: Three times a day (TID) | ORAL | Status: DC
  Administered 2020-08-18 – 2020-08-19 (×5): 100 mg via ORAL
  Filled 2020-08-18 (×5): qty 2

## 2020-08-18 MED ORDER — CEFDINIR 300 MG PO CAPS
300.0000 mg | ORAL_CAPSULE | Freq: Two times a day (BID) | ORAL | Status: DC
  Administered 2020-08-18 – 2020-08-19 (×3): 300 mg via ORAL
  Filled 2020-08-18 (×3): qty 1

## 2020-08-18 MED ORDER — ATORVASTATIN CALCIUM 40 MG PO TABS
40.0000 mg | ORAL_TABLET | Freq: Every day | ORAL | Status: DC
  Administered 2020-08-18 – 2020-08-19 (×2): 40 mg via ORAL
  Filled 2020-08-18 (×2): qty 1

## 2020-08-18 MED ORDER — POTASSIUM CHLORIDE CRYS ER 20 MEQ PO TBCR
40.0000 meq | EXTENDED_RELEASE_TABLET | Freq: Once | ORAL | Status: AC
  Administered 2020-08-18: 09:00:00 40 meq via ORAL
  Filled 2020-08-18: qty 2

## 2020-08-18 NOTE — Progress Notes (Signed)
Progress Note  Patient Name: Rebecca Donovan Date of Encounter: 08/18/2020  Attending physician: Bonnielee Haff, MD Primary care provider: Patient, No Pcp Per Primary Cardiologist:  Consultant:Rebecca Donovan Rebecca Skains, DO  Subjective: Rebecca Donovan is a 44 y.o. female who was seen and examined at bedside at approximately 815am No events overnight. Patient denies any chest pain or shortness of breath at rest or with effort related activities, no orthopnea, paroxysmal nocturnal dyspnea or lower extremity swelling. Case discussed and reviewed with her nurse.  Objective: Vital Signs in the last 24 hours: Temp:  [98 F (36.7 C)-98.4 F (36.9 C)] 98.2 F (36.8 C) (12/30 0358) Pulse Rate:  [81-91] 91 (12/30 0358) Resp:  [14-20] 20 (12/30 0358) BP: (163-189)/(104-111) 185/109 (12/30 0358) SpO2:  [98 %-99 %] 98 % (12/30 0358) Weight:  [103.8 kg] 103.8 kg (12/30 0300)  Intake/Output:  Intake/Output Summary (Last 24 hours) at 08/18/2020 0831 Last data filed at 08/18/2020 0300 Gross per 24 hour  Intake 400 ml  Output -  Net 400 ml    Net IO Since Admission: 740 mL [08/18/20 0831]  Telemetry: Personally reviewed.  Physical examination: PHYSICAL EXAM: Vitals with BMI 08/18/2020 08/17/2020 08/17/2020  Height - - -  Weight 228 lbs 13 oz - -  BMI 123XX123 - -  Systolic 123XX123 [id number]  Diastolic 0000000 [id number]  Pulse 91 90 81    CONSTITUTIONAL: Appears older than stated age, well-developed and well-nourished. No acute distress.  SKIN: Skin is warm and dry. No rash noted. No cyanosis. No pallor. No jaundice HEAD: Normocephalic and atraumatic.  EYES: No scleral icterus MOUTH/THROAT: Moist oral membranes.  NECK: No JVD present. No thyromegaly noted. No carotid bruits  LYMPHATIC: No visible cervical adenopathy.  CHEST Normal respiratory effort. No intercostal retractions  LUNGS: Mild expiratory wheezes and rales noted at the bases CARDIOVASCULAR: Regular, positive S1-S2, no murmurs rubs or gallops  appreciated. ABDOMINAL: Obese, soft, nontender, distended, positive bowel sounds in all 4 quadrants, no apparent ascites.  EXTREMITIES: No peripheral edema  HEMATOLOGIC: No significant bruising NEUROLOGIC: Oriented to person, place, and time. Nonfocal. Normal muscle tone.  PSYCHIATRIC: Normal mood and affect. Normal behavior. Cooperative  Lab Results: Hematology Recent Labs  Lab 08/16/20 0655 08/17/20 0359 08/18/20 0421  WBC 15.1* 11.8* 10.1  RBC 5.07 4.57 5.01  HGB 14.5 13.0 14.1  HCT 44.3 39.9 44.1  MCV 87.4 87.3 88.0  MCH 28.6 28.4 28.1  MCHC 32.7 32.6 32.0  RDW 14.4 14.6 14.6  PLT 259 249 255    Chemistry Recent Labs  Lab 08/16/20 0655 08/17/20 0359 08/18/20 0421  NA 140 138 138  K 3.9 3.6 3.4*  CL 106 105 104  CO2 22 21* 24  GLUCOSE 110* 94 110*  BUN 14 15 20   CREATININE 1.11* 1.15* 1.27*  CALCIUM 9.1 8.7* 8.9  PROT 7.8 6.4*  --   ALBUMIN 4.3 3.6  --   AST 25 16  --   ALT 21 15  --   ALKPHOS 65 59  --   BILITOT 0.5 0.6  --   GFRNONAA >60 >60 54*  ANIONGAP 12 12 10      Cardiac Enzymes: Cardiac Panel (last 3 results) Recent Labs    08/16/20 0655 08/16/20 0814  TROPONINIHS 33* 28*    BNP (last 3 results) Recent Labs    08/16/20 0655  BNP 252.4*    ProBNP (last 3 results) No results for input(s): PROBNP in the last 8760 hours.   DDimer  Recent Labs  Lab 08/16/20 0655  DDIMER 0.64*     Hemoglobin A1c:  Lab Results  Component Value Date   HGBA1C 5.1 08/18/2020   MPG 99.67 08/18/2020    TSH  Recent Labs    08/18/20 0421  TSH 5.218*    Lipid Panel     Component Value Date/Time   CHOL 216 (H) 08/18/2020 0421   TRIG 168 (H) 08/18/2020 0421   HDL 46 08/18/2020 0421   CHOLHDL 4.7 08/18/2020 0421   VLDL 34 08/18/2020 0421   LDLCALC 136 (H) 08/18/2020 0421    Imaging: CT Angio Chest PE W and/or Wo Contrast  Result Date: 08/16/2020 CLINICAL DATA:  44 year old female with shortness of breath and fever. EXAM: CT ANGIOGRAPHY  CHEST WITH CONTRAST TECHNIQUE: Multidetector CT imaging of the chest was performed using the standard protocol during bolus administration of intravenous contrast. Multiplanar CT image reconstructions and MIPs were obtained to evaluate the vascular anatomy. CONTRAST:  100 mL Omnipaque 350, intravenous COMPARISON:  None. FINDINGS: Cardiovascular: Satisfactory opacification of the pulmonary arteries to the segmental level. No evidence of pulmonary embolism. Moderate global cardiomegaly. Moderate pericardial effusion, measuring up to 1.4 cm along the lateral aspect of the right atrium. Mediastinum/Nodes: Scattered mediastinal prominent lymph nodes, for example paratracheal (CT series 5, image 27) which measures up to 1.3 cm in short axis. No hilar axillary lymph nodes. The thyroid gland, trachea, and esophagus demonstrate normal appearance. Lungs/Pleura: Fine parenchymal detail is limited by respiratory motion artifact. Mild diffuse mosaic attenuation pattern, likely secondary to expiratory phase image acquisition. Upper lobe predominant scattered bilateral ground-glass nodular opacities in a centrilobular distribution. No pleural effusion thorax. Upper Abdomen: The visualized upper abdomen is within normal limits. Musculoskeletal: No chest wall abnormality. No acute or significant osseous findings. Review of the MIP images confirms the above findings. IMPRESSION: Vascular: No evidence of pulmonary embolism. Non-Vascular: 1. Upper lobe predominant diffuse ground-glass opacities in a centrilobular distribution, favored represent multifocal pneumonia in the setting of fever and cough. This is atypical presentation for multifocal pneumonia associated with COVID-19, however plausible. Scattered prominent mediastinal lymph nodes, likely reactive in the setting of infectious/inflammatory etiology. 2. Moderate pericardial effusion measuring up to 1.4 cm in thickness. 3. Moderate global cardiomegaly. Ruthann Cancer, MD [facility] Electronically Signed   By: Ruthann Cancer MD   On: 08/16/2020 09:33   ECHOCARDIOGRAM COMPLETE  Result Date: 08/17/2020    ECHOCARDIOGRAM REPORT   Patient Name:   RUBELL CANTARELLA Date of Exam: 08/16/2020 Medical Rec #:  [id number]   Height:       62.0 in Accession #:    [id number]  Weight:       231.5 lb Date of Birth:  July 08, 1976   BSA:          2.034 m Patient Age:    8 years    BP:           177/101 mmHg Patient Gender: F           HR:           83 bpm. Exam Location:  Inpatient Procedure: 2D Echo, Color Doppler and Cardiac Doppler Indications:    [id number] Acute systolic (congestive) heart failure  History:        Patient has no prior history of Echocardiogram examinations.                 Risk Factors:Hypertension.  Sonographer:    Weiner Referring Phys: [phone]  TYRONE A KYLE IMPRESSIONS  1. Left ventricular ejection fraction, by estimation, is 45 to 50%. The left ventricle has mildly decreased function. The left ventricle demonstrates global hypokinesis. The left ventricular internal cavity size was mildly dilated. There is severe left ventricular hypertrophy. Left ventricular diastolic parameters are consistent with Grade II diastolic dysfunction (pseudonormalization). Elevated left atrial pressure.  2. Right ventricular systolic function is normal. The right ventricular size is normal. Tricuspid regurgitation signal is inadequate for assessing PA pressure.  3. A small pericardial effusion is present.  4. The mitral valve is normal in structure. No evidence of mitral valve regurgitation. No evidence of mitral stenosis.  5. The aortic valve was not well visualized. Aortic valve regurgitation is not visualized.  6. The inferior vena cava is dilated in size with >50% respiratory variability, suggesting right atrial pressure of 8 mmHg. FINDINGS  Left Ventricle: Left ventricular ejection fraction, by estimation, is 45 to 50%. The left ventricle  has mildly decreased function. The left ventricle demonstrates global hypokinesis. The left ventricular internal cavity size was mildly dilated. There is  severe left ventricular hypertrophy. Left ventricular diastolic parameters are consistent with Grade II diastolic dysfunction (pseudonormalization). Elevated left atrial pressure. Right Ventricle: The right ventricular size is normal. No increase in right ventricular wall thickness. Right ventricular systolic function is normal. Tricuspid regurgitation signal is inadequate for assessing PA pressure. Left Atrium: Left atrial size was normal in size. Right Atrium: Right atrial size was normal in size. Pericardium: A small pericardial effusion is present. Mitral Valve: The mitral valve is normal in structure. No evidence of mitral valve regurgitation. No evidence of mitral valve stenosis. Tricuspid Valve: The tricuspid valve is normal in structure. Tricuspid valve regurgitation is not demonstrated. Aortic Valve: The aortic valve was not well visualized. Aortic valve regurgitation is not visualized. Pulmonic Valve: The pulmonic valve was not well visualized. Pulmonic valve regurgitation is not visualized. Aorta: The aortic root and ascending aorta are structurally normal, with no evidence of dilitation. Venous: The inferior vena cava is dilated in size with greater than 50% respiratory variability, suggesting right atrial pressure of 8 mmHg. IAS/Shunts: The interatrial septum was not well visualized.  LEFT VENTRICLE PLAX 2D LVIDd:         5.40 cm  Diastology LVIDs:         4.10 cm  LV e' medial:  4.03 cm/s LV PW:         1.80 cm  LV e' lateral: 4.03 cm/s LV IVS:        1.20 cm LVOT diam:     2.00 cm LV SV:         50 LV SV Index:   25 LVOT Area:     3.14 cm  RIGHT VENTRICLE RV S prime:     11.90 cm/s TAPSE (M-mode): 2.0 cm LEFT ATRIUM             Index       RIGHT ATRIUM           Index LA diam:        3.70 cm 1.82 cm/m  RA Area:     16.10 cm LA Vol (A2C):   65.9  ml 32.40 ml/m RA Volume:   39.00 ml  19.17 ml/m LA Vol (A4C):   63.4 ml 31.17 ml/m LA Biplane Vol: 65.6 ml 32.25 ml/m  AORTIC VALVE LVOT Vmax:   77.00 cm/s LVOT Vmean:  55.900 cm/s LVOT VTI:    0.159 m  AORTA Ao Root diam: 3.00 cm Ao Asc diam:  3.40 cm  SHUNTS Systemic VTI:  0.16 m Systemic Diam: 2.00 cm Epifanio Lesches MD Electronically signed by Epifanio Lesches MD Signature Date/Time: 08/17/2020/12:29:50 PM    Final     Cardiac database: EKG: 08/17/2020: Normal sinus rhythm, 81 bpm, left axis deviation, poor R wave progression, LVH, ST-T changes in the high lateral and lateral leads possibly secondary to LVH but underlying ischemia cannot be ruled out.  Echocardiogram: 08/17/2020: 1. Left ventricular ejection fraction, by estimation, is 45 to 50%. The  left ventricle has mildly decreased function. The left ventricle  demonstrates global hypokinesis. The left ventricular internal cavity size  was mildly dilated. There is severe left  ventricular hypertrophy. Left ventricular diastolic parameters are  consistent with Grade II diastolic dysfunction (pseudonormalization).  Elevated left atrial pressure.  2. Right ventricular systolic function is normal. The right ventricular  size is normal. Tricuspid regurgitation signal is inadequate for assessing  PA pressure.  3. A small pericardial effusion is present.  4. The mitral valve is normal in structure. No evidence of mitral valve  regurgitation. No evidence of mitral stenosis.  5. The aortic valve was not well visualized. Aortic valve regurgitation  is not visualized.  6. The inferior vena cava is dilated in size with >50% respiratory  variability, suggesting right atrial pressure of 8 mmHg.   Scheduled Meds: . azithromycin  500 mg Oral Daily  . carvedilol  25 mg Oral BID WC  . enoxaparin (LOVENOX) injection  40 mg Subcutaneous Q24H  . fluticasone  2 spray Each Nare Daily  . hydrALAZINE  100 mg Oral Q8H  . losartan   50 mg Oral Daily  . potassium chloride  40 mEq Oral Once  . spironolactone  25 mg Oral Daily    Continuous Infusions: . cefTRIAXone (ROCEPHIN)  IV 2 g (08/17/20 1523)    PRN Meds: acetaminophen **OR** acetaminophen, ALPRAZolam, hydrALAZINE, ondansetron **OR** ondansetron (ZOFRAN) IV, oxyCODONE, prochlorperazine   IMPRESSION & RECOMMENDATIONS: Rebecca Donovan is a 44 y.o. female whose past medical history and cardiac risk factors include: Hypertension, cardiomyopathy, severe LVH per echocardiogram, pericardial effusion, asthma, migraine, obesity due to excess calories.  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 Echocardiogram notes mildly reduced left ventricular systolic function with global hypokinesis. I suspect that most likely secondary to untreated hypertension.  She was at [facility] back in November 2021 and based on the ER documentation her blood pressure at that time was 225/151. EKG shows normal sinus rhythm with LVH.  She does have ST T changes in the high lateral and lateral leads which may be secondary to left ventricular hypertrophy but underlying ischemia cannot be ruled out.  This was explained to the patient in great detail yesterday as well as today.  Recommended ischemic evaluation with either stress test or coronary CTA prior to discharge.  However, patient refuses it at this time I would like to have her blood pressures addressed and will follow-up outpatient for ischemic evaluation. UDS positive for marijuana. TSH within normal limits. Fasting lipid profile reviewed.  Estimated 10-year risk of ASCVD 2.9%.  Educated on lifestyle modifications with improvement after lipid profile.  Follow-up outpatient.  Elevated troponins most likely secondary to type II MI: High sensitive troponins relatively flat. Patient does not have any chest pain since improvement in her  blood pressures. Discussed ischemic evaluation as noted above. Continue to monitor  Hypertension:  Continue current medical therapy. Discussed additional antihypertensive medications that can be added if needed with primary team. She would benefit from a sleep study once discharged.  Small pericardial effusion: Continue to monitor  Community-acquired pneumonia: Currently managed per primary team.  Educated her on importance of following up outpatient to help improve her modifiable cardiovascular risk factors and to uptitrate guideline directed medical therapy.  She is encouraged to establish care with a PCP once discharged as well.  Patient's questions and concerns were addressed to her satisfaction. She voices understanding of the instructions provided during this encounter.   This note was created using a voice recognition software as a result there may be grammatical errors inadvertently enclosed that do not reflect the nature of this encounter. Every attempt is made to correct such errors.  Total time spent: 27 minutes.  Discussed disease management with the patient, reviewed morning labs independently.  Independent review of telemetry, discussed plans of care history.  Plan of care discussed with attending physician.  Rex Kras, DO, [facility]. [place] Office: [phone] 08/18/2020, 8:31 AM

## 2020-08-18 NOTE — Plan of Care (Signed)
°  Problem: Education: Goal: Knowledge of General Education information will improve Description: Including pain rating scale, medication(s)/side effects and non-pharmacologic comfort measures Outcome: Progressing   Problem: Health Behavior/Discharge Planning: Goal: Ability to manage health-related needs will improve Outcome: Progressing   Problem: Clinical Measurements: Goal: Cardiovascular complication will be avoided Outcome: Progressing   Problem: Coping: Goal: Level of anxiety will decrease Outcome: Progressing   Problem: Pain Managment: Goal: General experience of comfort will improve Outcome: Progressing   

## 2020-08-18 NOTE — Progress Notes (Signed)
[facility] PROGRESS NOTE   Rebecca Donovan [id number] DOB: 1975-11-21 DOA: 08/16/2020  PCP: Patient, No Pcp Per  Brief History/Interval Summary: 44 y.o. female with medical history significant of HTN.  Presented with shortness of breath ongoing for about a week or so.  Present even at rest.  No history suggestive of orthopnea or PND.  She did have some fever chills and cold-like symptoms a few days ago.  It has been several years since she last sought medical attention.  Due to presence of pneumonia pericardial effusion and significant symptoms as well as uncontrolled hypertension patient was hospitalized for further management  Reason for Visit: Community-acquired pneumonia  Consultants: None yet  Procedures: Echocardiogram (see report below).   Antibiotics: Anti-infectives (From admission, onward)   Start     Dose/Rate Route Frequency Ordered Stop   08/16/20 1600  cefTRIAXone (ROCEPHIN) 2 g in sodium chloride 0.9 % 100 mL IVPB        2 g 200 mL/hr over 30 Minutes Intravenous Every 24 hours 08/16/20 1419 08/21/20 1559   08/16/20 1515  azithromycin (ZITHROMAX) tablet 500 mg        500 mg Oral Daily 08/16/20 1419 08/21/20 0959      Subjective/Interval History: Patient mentions that she is feeling better from a breathing standpoint.  Denies any chest pain.  No headaches.  Has been ambulating in the room.  No further nausea or vomiting    Assessment/Plan:  Hypertensive urgency Patient was started on multiple antihypertensives at admission including Cozaar, metoprolol and hydralazine.  Plan will be to decrease her blood pressure gradually.  She was initially noted to have blood pressure in the [id number] systolic.   Due to her low EF metoprolol changed over to carvedilol.  Continue hydralazine and losartan.  Spironolactone added by cardiology.  Depending on blood pressure trends may consider adding amlodipine. Avoid sudden drop in blood pressures.  Patient will likely need  medication assistance at discharge as she does not have insurance.  Community-acquired pneumonia CTA revealed multifocal pneumonia.  COVID-19 test as well as influenza PCR was negative.  Patient's WBC was noted to be elevated.  Patient started on ceftriaxone and azithromycin.  She noted to be normal now.  Symptoms have improved.  We will change her over to oral antibiotics.    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  Echocardiogram showed EF to be 45 to 50%.  Global hypokinesis was noted.  Severe LVH was seen.  Grade 2 diastolic dysfunction was also noted.  Patient was given a dose of Lasix yesterday.  Symptoms are better.  We will hold off on further doses due to rising creatinine. Pericardial effusion was noted to be small on the echocardiogram.  Cardiology was consulted.  They offered ischemic testing to the patient and she defers for now.  Will be pursued in the outpatient setting.   Pericardial effusion noted on CT angiogram.  Thought to be moderate.  On echocardiogram is noted to be small.  CRP 0.6.  Sed rate is 4.  TSH 5.2 which is only mildly elevated.  Will check free T4 in the morning.  ANA is pending.   Mildly elevated creatinine/hypokalemia  Slight rise in creatinine noted today.  Recheck tomorrow.  Replace potassium.  Hyperlipidemia LDL noted to be 136.  She will be started on a statin medication.  History of asthma Does not take inhalers at home.  No wheezing appreciated currently.  Anxiety Continue as needed anxiolytics.  Obesity Estimated body mass index is 41.85 kg/m as calculated from the following:   Height as of this encounter: 5\' 2"  (1.575 m).   Weight as of this encounter: 103.8 kg.   DVT Prophylaxis: Lovenox Code Status: Full code Family Communication: Discussed with the patient Disposition Plan: Hopefully return home when  improved  Status is: Inpatient  Remains inpatient appropriate because:IV treatments appropriate due to intensity of illness or inability to take PO and Inpatient level of care appropriate due to severity of illness   Dispo: The patient is from: Home              Anticipated d/c is to: Home              Anticipated d/c date is: 2 days              Patient currently is not medically stable to d/c.       Medications:  Scheduled: . azithromycin  500 mg Oral Daily  . carvedilol  25 mg Oral BID WC  . enoxaparin (LOVENOX) injection  40 mg Subcutaneous Q24H  . fluticasone  2 spray Each Nare Daily  . hydrALAZINE  100 mg Oral Q8H  . losartan  50 mg Oral Daily  . spironolactone  25 mg Oral Daily   Continuous: . cefTRIAXone (ROCEPHIN)  IV 2 g (08/17/20 1523)   YHC:[id number] **OR** acetaminophen, ALPRAZolam, hydrALAZINE, ondansetron **OR** ondansetron (ZOFRAN) IV, oxyCODONE, prochlorperazine   Objective:  Vital Signs  Vitals:   08/18/20 0300 08/18/20 0358 08/18/20 0923 08/18/20 1314  BP:  (!) 185/109 (!) 169/99 128/85  Pulse:  91 91 82  Resp:  20  18  Temp:  98.2 F (36.8 C) 98.3 F (36.8 C) 97.8 F (36.6 C)  TempSrc:  Oral Oral Oral  SpO2:  98% 98% 98%  Weight: 103.8 kg     Height:        Intake/Output Summary (Last 24 hours) at 08/18/2020 1336 Last data filed at 08/18/2020 0300 Gross per 24 hour  Intake 400 ml  Output --  Net 400 ml   Filed Weights   08/16/20 0412 08/17/20 0600 08/18/20 0300  Weight: 97.5 kg 105 kg 103.8 kg    General appearance: Awake alert.  In no distress Resp: Clear to auscultation bilaterally.  Normal effort Cardio: S1-S2 is normal regular.  No S3-S4.  No rubs murmurs or bruit GI: Abdomen is soft.  Nontender nondistended.  Bowel sounds are present normal.  No masses organomegaly Extremities: No edema.  Full range of motion of lower extremities. Neurologic: Alert and oriented x3.  No focal neurological deficits.     Lab  Results:  Data Reviewed: I have personally reviewed following labs and imaging studies  CBC: Recent Labs  Lab 08/16/20 0655 08/17/20 0359 08/18/20 0421  WBC 15.1* 11.8* 10.1  NEUTROABS 12.8*  --   --   HGB 14.5 13.0 14.1  HCT 44.3 39.9 44.1  MCV 87.4 87.3 88.0  PLT 259 249 255    Basic Metabolic Panel: Recent Labs  Lab 08/16/20 0655 08/17/20 0359 08/18/20 0421  NA 140 138 138  K 3.9 3.6 3.4*  CL 106 105 104  CO2 22 21* 24  GLUCOSE 110* 94 110*  BUN 14 15 20   CREATININE 1.11* 1.15* 1.27*  CALCIUM 9.1 8.7* 8.9    GFR: Estimated Creatinine Clearance: 64.6 mL/min (A) (by C-G formula based on SCr of 1.27 mg/dL (  H)).  Liver Function Tests: Recent Labs  Lab 08/16/20 0655 08/17/20 0359  AST 25 16  ALT 21 15  ALKPHOS 65 59  BILITOT 0.5 0.6  PROT 7.8 6.4*  ALBUMIN 4.3 3.6     Recent Results (from the past 240 hour(s))  Resp Panel by RT-PCR (Flu A&B, Covid) Nasopharyngeal Swab     Status: None   Collection Time: 08/16/20  6:55 AM   Specimen: Nasopharyngeal Swab; Nasopharyngeal(NP) swabs in vial transport medium  Result Value Ref Range Status   SARS Coronavirus 2 by RT PCR NEGATIVE NEGATIVE Final    Comment: (NOTE) SARS-CoV-2 target nucleic acids are NOT DETECTED.  The SARS-CoV-2 RNA is generally detectable in upper respiratory specimens during the acute phase of infection. The lowest concentration of SARS-CoV-2 viral copies this assay can detect is 138 copies/mL. A negative result does not preclude SARS-Cov-2 infection and should not be used as the sole basis for treatment or other patient management decisions. A negative result may occur with  improper specimen collection/handling, submission of specimen other than nasopharyngeal swab, presence of viral mutation(s) within the areas targeted by this assay, and inadequate number of viral copies(<138 copies/mL). A negative result must be combined with clinical observations, patient history, and  epidemiological information. The expected result is Negative.  Fact Sheet for Patients:  EntrepreneurPulse.com.au  Fact Sheet for Healthcare Providers:  IncredibleEmployment.be  This test is no t yet approved or cleared by the [place] FDA and  has been authorized for detection and/or diagnosis of SARS-CoV-2 by FDA under an Emergency Use Authorization (EUA). This EUA will remain  in effect (meaning this test can be used) for the duration of the COVID-19 declaration under Section 564(b)(1) of the Act, 21 U.S.C.section 360bbb-3(b)(1), unless the authorization is terminated  or revoked sooner.       Influenza A by PCR NEGATIVE NEGATIVE Final   Influenza B by PCR NEGATIVE NEGATIVE Final    Comment: (NOTE) The Xpert Xpress SARS-CoV-2/FLU/RSV plus assay is intended as an aid in the diagnosis of influenza from Nasopharyngeal swab specimens and should not be used as a sole basis for treatment. Nasal washings and aspirates are unacceptable for Xpert Xpress SARS-CoV-2/FLU/RSV testing.  Fact Sheet for Patients: EntrepreneurPulse.com.au  Fact Sheet for Healthcare Providers: IncredibleEmployment.be  This test is not yet approved or cleared by the [place] FDA and has been authorized for detection and/or diagnosis of SARS-CoV-2 by FDA under an Emergency Use Authorization (EUA). This EUA will remain in effect (meaning this test can be used) for the duration of the COVID-19 declaration under Section 564(b)(1) of the Act, 21 U.S.C. section 360bbb-3(b)(1), unless the authorization is terminated or revoked.  Performed at [facility], [place] [address] [place], [place] [address]       Radiology Studies: ECHOCARDIOGRAM COMPLETE  Result Date: 08/17/2020    ECHOCARDIOGRAM REPORT   Patient Name:   Rebecca Donovan Date of Exam: 08/16/2020 Medical Rec #:  [id number]   Height:       62.0 in Accession  #:    [id number]  Weight:       231.5 lb Date of Birth:  [birth year]   BSA:          2.034 m Patient Age:    60 years    BP:           177/101 mmHg Patient Gender: F           HR:  83 bpm. Exam Location:  Inpatient Procedure: 2D Echo, Color Doppler and Cardiac Doppler Indications:    [id number] Acute systolic (congestive) heart failure  History:        Patient has no prior history of Echocardiogram examinations.                 Risk Factors:Hypertension.  Sonographer:    Raquel Sarna Senior RDCS Referring Phys: [id number] [place]  1. Left ventricular ejection fraction, by estimation, is 45 to 50%. The left ventricle has mildly decreased function. The left ventricle demonstrates global hypokinesis. The left ventricular internal cavity size was mildly dilated. There is severe left ventricular hypertrophy. Left ventricular diastolic parameters are consistent with Grade II diastolic dysfunction (pseudonormalization). Elevated left atrial pressure.  2. Right ventricular systolic function is normal. The right ventricular size is normal. Tricuspid regurgitation signal is inadequate for assessing PA pressure.  3. A small pericardial effusion is present.  4. The mitral valve is normal in structure. No evidence of mitral valve regurgitation. No evidence of mitral stenosis.  5. The aortic valve was not well visualized. Aortic valve regurgitation is not visualized.  6. The inferior vena cava is dilated in size with >50% respiratory variability, suggesting right atrial pressure of 8 mmHg. FINDINGS  Left Ventricle: Left ventricular ejection fraction, by estimation, is 45 to 50%. The left ventricle has mildly decreased function. The left ventricle demonstrates global hypokinesis. The left ventricular internal cavity size was mildly dilated. There is  severe left ventricular hypertrophy. Left ventricular diastolic parameters are consistent with Grade II diastolic dysfunction (pseudonormalization). Elevated left atrial  pressure. Right Ventricle: The right ventricular size is normal. No increase in right ventricular wall thickness. Right ventricular systolic function is normal. Tricuspid regurgitation signal is inadequate for assessing PA pressure. Left Atrium: Left atrial size was normal in size. Right Atrium: Right atrial size was normal in size. Pericardium: A small pericardial effusion is present. Mitral Valve: The mitral valve is normal in structure. No evidence of mitral valve regurgitation. No evidence of mitral valve stenosis. Tricuspid Valve: The tricuspid valve is normal in structure. Tricuspid valve regurgitation is not demonstrated. Aortic Valve: The aortic valve was not well visualized. Aortic valve regurgitation is not visualized. Pulmonic Valve: The pulmonic valve was not well visualized. Pulmonic valve regurgitation is not visualized. Aorta: The aortic root and ascending aorta are structurally normal, with no evidence of dilitation. Venous: The inferior vena cava is dilated in size with greater than 50% respiratory variability, suggesting right atrial pressure of 8 mmHg. IAS/Shunts: The interatrial septum was not well visualized.  LEFT VENTRICLE PLAX 2D LVIDd:         5.40 cm  Diastology LVIDs:         4.10 cm  LV e' medial:  4.03 cm/s LV PW:         1.80 cm  LV e' lateral: 4.03 cm/s LV IVS:        1.20 cm LVOT diam:     2.00 cm LV SV:         50 LV SV Index:   25 LVOT Area:     3.14 cm  RIGHT VENTRICLE RV S prime:     11.90 cm/s TAPSE (M-mode): 2.0 cm LEFT ATRIUM             Index       RIGHT ATRIUM           Index LA diam:  3.70 cm 1.82 cm/m  RA Area:     16.10 cm LA Vol (A2C):   65.9 ml 32.40 ml/m RA Volume:   39.00 ml  19.17 ml/m LA Vol (A4C):   63.4 ml 31.17 ml/m LA Biplane Vol: 65.6 ml 32.25 ml/m  AORTIC VALVE LVOT Vmax:   77.00 cm/s LVOT Vmean:  55.900 cm/s LVOT VTI:    0.159 m  AORTA Ao Root diam: 3.00 cm Ao Asc diam:  3.40 cm  SHUNTS Systemic VTI:  0.16 m Systemic Diam: 2.00 cm Epifanio Lesches MD Electronically signed by Epifanio Lesches MD Signature Date/Time: 08/17/2020/12:29:50 PM    Final        LOS: 2 days   Osvaldo Shipper  [facility] Pager on [email or web address]  08/18/2020, 1:36 PM

## 2020-08-19 DIAGNOSIS — J189 Pneumonia, unspecified organism: Secondary | ICD-10-CM

## 2020-08-19 LAB — CBC
HCT: 44 % (ref 36.0–46.0)
Hemoglobin: 14 g/dL (ref 12.0–15.0)
MCH: 28.5 pg (ref 26.0–34.0)
MCHC: 31.8 g/dL (ref 30.0–36.0)
MCV: 89.6 fL (ref 80.0–100.0)
Platelets: 244 10*3/uL (ref 150–400)
RBC: 4.91 MIL/uL (ref 3.87–5.11)
RDW: 14.5 % (ref 11.5–15.5)
WBC: 9.2 10*3/uL (ref 4.0–10.5)
nRBC: 0 % (ref 0.0–0.2)

## 2020-08-19 LAB — BASIC METABOLIC PANEL
Anion gap: 9 (ref 5–15)
BUN: 21 mg/dL — ABNORMAL HIGH (ref 6–20)
CO2: 22 mmol/L (ref 22–32)
Calcium: 8.8 mg/dL — ABNORMAL LOW (ref 8.9–10.3)
Chloride: 106 mmol/L (ref 98–111)
Creatinine, Ser: 1.28 mg/dL — ABNORMAL HIGH (ref 0.44–1.00)
GFR, Estimated: 53 mL/min — ABNORMAL LOW (ref >60)
Glucose, Bld: 113 mg/dL — ABNORMAL HIGH (ref 70–99)
Potassium: 3.8 mmol/L (ref 3.5–5.1)
Sodium: 137 mmol/L (ref 135–145)

## 2020-08-19 MED ORDER — CARVEDILOL 25 MG PO TABS: 25.0000 mg | ORAL_TABLET | Freq: Two times a day (BID) | ORAL | 0 refills | Status: DC

## 2020-08-19 MED ORDER — ATORVASTATIN CALCIUM 40 MG PO TABS: 40.0000 mg | ORAL_TABLET | Freq: Every day | ORAL | 0 refills | Status: DC

## 2020-08-19 MED ORDER — AZITHROMYCIN 250 MG PO TABS: 500.0000 mg | ORAL_TABLET | Freq: Every day | ORAL | 0 refills | Status: AC

## 2020-08-19 MED ORDER — LOSARTAN POTASSIUM 50 MG PO TABS: 50.0000 mg | ORAL_TABLET | Freq: Every day | ORAL | 0 refills | Status: DC

## 2020-08-19 MED ORDER — SPIRONOLACTONE 25 MG PO TABS: 25.0000 mg | ORAL_TABLET | Freq: Every day | ORAL | 0 refills | Status: DC

## 2020-08-19 MED ORDER — CEFDINIR 300 MG PO CAPS: 300.0000 mg | ORAL_CAPSULE | Freq: Two times a day (BID) | ORAL | 0 refills | Status: AC

## 2020-08-19 MED ORDER — HYDRALAZINE HCL 100 MG PO TABS: 100.0000 mg | ORAL_TABLET | Freq: Three times a day (TID) | ORAL | 0 refills | Status: DC

## 2020-08-19 NOTE — Discharge Summary (Signed)
Discharge Summary  Verlaine Queenan [id number] DOB: 1975-11-03  PCP: Patient, No Pcp Per  Admit date: 08/16/2020 Discharge date: 08/19/2020  Time spent:  67mins  Recommendations for Outpatient Follow-up:  1. F/u with PCP within a week  for hospital discharge follow up, repeat cbc/bmp at follow up 2. F/u with cardiology Dr Terri Skains  Discharge Diagnoses:  Active Hospital Problems   Diagnosis Date Noted  . Cardiomyopathy ([place])   . Hypertensive emergency   . SOB (shortness of breath)   . Elevated troponin level not due to acute coronary syndrome   . Community acquired pneumonia   . Pericardial effusion   . Class 3 severe obesity due to excess calories with serious comorbidity and body mass index (BMI) of 40.0 to 44.9 in adult [facility])   . Elevated brain natriuretic peptide (BNP) level   . Hypertensive urgency 08/16/2020    Resolved Hospital Problems  No resolved problems to display.    Discharge Condition: stable  Diet recommendation: heart healthy  Filed Weights   08/17/20 0600 08/18/20 0300 08/19/20 0429  Weight: 105 kg 103.8 kg 103.7 kg    History of present illness: ( per admitting provider Dr Marylyn Ishihara) Rebecca Donovan is a 44 y.o. female with medical history significant of HTN. Presenting with dyspnea. She reports that yesterday morning, she woke up with difficulty breathing. He chest was heavy. She thought it was asthma. She tried to take showers, some homeopathic oil, OTC decongestants/cold medicines, and a car drive to make things better. However, she got no relief. Her symptoms continued throughout the day into the night. She states that this morning, she stopped breathing while asleep. She became concerned and came to the ED. She denies any other aggravating or alleviating factors.     Hospital Course:  Active Problems:   Hypertensive urgency   Cardiomyopathy ([place])   Hypertensive emergency   SOB (shortness of breath)   Elevated troponin level not due to acute coronary  syndrome   Community acquired pneumonia   Pericardial effusion   Class 3 severe obesity due to excess calories with serious comorbidity and body mass index (BMI) of 40.0 to 44.9 in adult [facility])   Elevated brain natriuretic peptide (BNP) level   Hypertensive urgency Much improved on Coreg, hydralazine, losartan, spironolactone, she needs hours to go home, she stated can afford the medication She is advised to check blood pressure at home, bring blood pressure recording to her PCP and cardiologist for further blood pressure medication adjustment  Community-acquired pneumonia CTA revealed multifocal pneumonia.  COVID-19 test as well as influenza PCR was negative.  Patient's WBC was noted to be elevated.  Patient started on ceftriaxone and azithromycin.  She noted to be normal now.  Symptoms have improved. change her over to oral antibiotics.    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  Echocardiogram showed EF to be 45 to 50%.  Global hypokinesis was noted.  Severe LVH was seen.  Grade 2 diastolic dysfunction was also noted.   Patient was given a dose of Lasix in the hospital.  Symptoms are better. hold off on further doses due to rising creatinine. Pericardial effusion was noted to be small on the echocardiogram.  Cardiology was consulted.  They offered ischemic testing to the patient and she defers for now.  Will be pursued in the outpatient setting.   Pericardial effusion noted on CT angiogram.  Thought to be moderate.  On echocardiogram is noted to be small.  CRP 0.6.  Sed rate is 4.    ANA in process, PCP to follow-up on ANA Follow-up with cardiology  TSH 5.2 which is only mildly elevated.    Follow-up with PCP to recheck TSH  Mildly elevated creatinine/hypokalemia  Creatinine appear plateaued at 1.27 -1.28, she does not want to stay in hospital, she state will  have BMP checked in 1 week at her PCP Potassium replaced and improved  Hyperlipidemia LDL noted to be 136.  started on a statin medication.  History of asthma Does not take inhalers at home.  No wheezing appreciated currently.  Anxiety Continue as needed anxiolytics.  Class III obesity Body mass index is 41.83 kg/m. Denies history of OSA,  Encourage lifestyle modification    Consultations:  Cardiology  Discharge Exam: BP 140/85 (BP Location: Right Arm)   Pulse 81   Temp 97.7 F (36.5 C) (Oral)   Resp 18   Ht 5\' 2"  (1.575 m)   Wt 103.7 kg   LMP 08/16/2020   SpO2 100%   BMI 41.83 kg/m   General: NAD Cardiovascular: RRR Respiratory: Normal respiratory effort  Discharge Instructions You were cared for by a hospitalist during your hospital stay. If you have any questions about your discharge medications or the care you received while you were in the hospital after you are discharged, you can call the unit and asked to speak with the hospitalist on call if the hospitalist that took care of you is not available. Once you are discharged, your primary care physician will handle any further medical issues. Please note that NO REFILLS for any discharge medications will be authorized once you are discharged, as it is imperative that you return to your primary care physician (or establish a relationship with a primary care physician if you do not have one) for your aftercare needs so that they can reassess your need for medications and monitor your lab values.  Discharge Instructions    Diet - low sodium heart healthy   Complete by: As directed    Increase activity slowly   Complete by: As directed      Allergies as of 08/19/2020   No Known Allergies     Medication List    TAKE these medications   [facility] Arnica Crea Apply 1 application topically daily as needed (bruises).   aspirin-acetaminophen-caffeine 250-250-65 MG tablet Commonly known as: EXCEDRIN  MIGRAINE Take 2 tablets by mouth every 6 (six) hours as needed for headache. What changed: Another medication with the same name was removed. Continue taking this medication, and follow the directions you see here.   atorvastatin 40 MG tablet Commonly known as: LIPITOR Take 1 tablet (40 mg total) by mouth daily. Start taking on: August 20, 2020   azithromycin 250 MG tablet Commonly known as: ZITHROMAX Take 2 tablets (500 mg total) by mouth daily for 1 day.   carvedilol 25 MG tablet Commonly known as: COREG Take 1 tablet (25 mg total) by mouth 2 (two) times daily with a meal.   cefdinir 300 MG capsule Commonly known as: OMNICEF Take 1 capsule (300 mg total) by mouth every 12 (twelve) hours for 1 day.   hydrALAZINE 100 MG tablet Commonly known as: APRESOLINE Take 1 tablet (100 mg total) by mouth every 8 (eight) hours.   losartan 50 MG tablet Commonly known as: COZAAR Take 1 tablet (50 mg total) by mouth daily. Start taking on: August 20, 2020   OVER  THE COUNTER MEDICATION Place 1 patch onto the skin daily. Innovative multi vitamin patch   spironolactone 25 MG tablet Commonly known as: ALDACTONE Take 1 tablet (25 mg total) by mouth daily. Start taking on: August 20, 2020      No Known Allergies  Follow-up Information    Tolia, Sunit, DO Follow up in 1 week(s).   Specialties: Cardiology, Radiology, Vascular Surgery Why: for heart failure management Contact information: [place] [address] [place] Follow up.   Contact information: [address] [place] [phone]       repeat bmp to follow up on kidney function Follow up in 1 week(s).                The results of significant diagnostics from this hospitalization (including imaging, microbiology, ancillary and laboratory) are listed below for reference.    Significant Diagnostic  Studies: CT Angio Chest PE W and/or Wo Contrast  Result Date: 08/16/2020 CLINICAL DATA:  44 year old female with shortness of breath and fever. EXAM: CT ANGIOGRAPHY CHEST WITH CONTRAST TECHNIQUE: Multidetector CT imaging of the chest was performed using the standard protocol during bolus administration of intravenous contrast. Multiplanar CT image reconstructions and MIPs were obtained to evaluate the vascular anatomy. CONTRAST:  100 mL Omnipaque 350, intravenous COMPARISON:  None. FINDINGS: Cardiovascular: Satisfactory opacification of the pulmonary arteries to the segmental level. No evidence of pulmonary embolism. Moderate global cardiomegaly. Moderate pericardial effusion, measuring up to 1.4 cm along the lateral aspect of the right atrium. Mediastinum/Nodes: Scattered mediastinal prominent lymph nodes, for example paratracheal (CT series 5, image 27) which measures up to 1.3 cm in short axis. No hilar axillary lymph nodes. The thyroid gland, trachea, and esophagus demonstrate normal appearance. Lungs/Pleura: Fine parenchymal detail is limited by respiratory motion artifact. Mild diffuse mosaic attenuation pattern, likely secondary to expiratory phase image acquisition. Upper lobe predominant scattered bilateral ground-glass nodular opacities in a centrilobular distribution. No pleural effusion thorax. Upper Abdomen: The visualized upper abdomen is within normal limits. Musculoskeletal: No chest wall abnormality. No acute or significant osseous findings. Review of the MIP images confirms the above findings. IMPRESSION: Vascular: No evidence of pulmonary embolism. Non-Vascular: 1. Upper lobe predominant diffuse ground-glass opacities in a centrilobular distribution, favored represent multifocal pneumonia in the setting of fever and cough. This is atypical presentation for multifocal pneumonia associated with COVID-19, however plausible. Scattered prominent mediastinal lymph nodes, likely reactive in the  setting of infectious/inflammatory etiology. 2. Moderate pericardial effusion measuring up to 1.4 cm in thickness. 3. Moderate global cardiomegaly. Ruthann Cancer, MD [facility] Radiology Electronically Signed   By: Ruthann Cancer MD   On: 08/16/2020 09:33   DG Chest Portable 1 View  Result Date: 08/16/2020 CLINICAL DATA:  Shortness of breath.  Fever. EXAM: PORTABLE CHEST 1 VIEW COMPARISON:  No prior. FINDINGS: Mediastinum and hilar structures normal. Cardiomegaly. Low lung volumes with mild bibasilar atelectasis. Mild bilateral interstitial prominence. Mild interstitial edema and/or pneumonitis cannot be excluded. Mild elevation left hemidiaphragm. No prominent pleural effusion. No pneumothorax. IMPRESSION: 1. Cardiomegaly. 2. Low lung volumes with mild bibasilar atelectasis. Mild bilateral interstitial prominence. Mild interstitial edema and/or pneumonitis cannot be excluded. Electronically Signed   By: Marcello Moores  Register   On: 08/16/2020 06:33   ECHOCARDIOGRAM COMPLETE  Result Date: 08/17/2020    ECHOCARDIOGRAM REPORT   Patient Name:   Rebecca Donovan  Stockinger Date of Exam: 08/16/2020 Medical Rec #:  [id number]   Height:       62.0 in Accession #:    [id number]  Weight:       231.5 lb Date of Birth:  May 13, 1976   BSA:          2.034 m Patient Age:    44 years    BP:           177/101 mmHg Patient Gender: F           HR:           83 bpm. Exam Location:  Inpatient Procedure: 2D Echo, Color Doppler and Cardiac Doppler Indications:    I50.21 Acute systolic (congestive) heart failure  History:        Patient has no prior history of Echocardiogram examinations.                 Risk Factors:Hypertension.  Sonographer:    Irving Burton Senior RDCS Referring Phys: [id number] Teddy Spike IMPRESSIONS  1. Left ventricular ejection fraction, by estimation, is 45 to 50%. The left ventricle has mildly decreased function. The left ventricle demonstrates global hypokinesis. The left ventricular  internal cavity size was mildly dilated. There is severe left ventricular hypertrophy. Left ventricular diastolic parameters are consistent with Grade II diastolic dysfunction (pseudonormalization). Elevated left atrial pressure.  2. Right ventricular systolic function is normal. The right ventricular size is normal. Tricuspid regurgitation signal is inadequate for assessing PA pressure.  3. A small pericardial effusion is present.  4. The mitral valve is normal in structure. No evidence of mitral valve regurgitation. No evidence of mitral stenosis.  5. The aortic valve was not well visualized. Aortic valve regurgitation is not visualized.  6. The inferior vena cava is dilated in size with >50% respiratory variability, suggesting right atrial pressure of 8 mmHg. FINDINGS  Left Ventricle: Left ventricular ejection fraction, by estimation, is 45 to 50%. The left ventricle has mildly decreased function. The left ventricle demonstrates global hypokinesis. The left ventricular internal cavity size was mildly dilated. There is  severe left ventricular hypertrophy. Left ventricular diastolic parameters are consistent with Grade II diastolic dysfunction (pseudonormalization). Elevated left atrial pressure. Right Ventricle: The right ventricular size is normal. No increase in right ventricular wall thickness. Right ventricular systolic function is normal. Tricuspid regurgitation signal is inadequate for assessing PA pressure. Left Atrium: Left atrial size was normal in size. Right Atrium: Right atrial size was normal in size. Pericardium: A small pericardial effusion is present. Mitral Valve: The mitral valve is normal in structure. No evidence of mitral valve regurgitation. No evidence of mitral valve stenosis. Tricuspid Valve: The tricuspid valve is normal in structure. Tricuspid valve regurgitation is not demonstrated. Aortic Valve: The aortic valve was not well visualized. Aortic valve regurgitation is not visualized.  Pulmonic Valve: The pulmonic valve was not well visualized. Pulmonic valve regurgitation is not visualized. Aorta: The aortic root and ascending aorta are structurally normal, with no evidence of dilitation. Venous: The inferior vena cava is dilated in size with greater than 50% respiratory variability, suggesting right atrial pressure of 8 mmHg. IAS/Shunts: The interatrial septum was not well visualized.  LEFT VENTRICLE PLAX 2D LVIDd:         5.40 cm  Diastology LVIDs:         4.10 cm  LV e' medial:  4.03 cm/s LV PW:         1.80 cm  LV e' lateral: 4.03  cm/s LV IVS:        1.20 cm LVOT diam:     2.00 cm LV SV:         50 LV SV Index:   25 LVOT Area:     3.14 cm  RIGHT VENTRICLE RV S prime:     11.90 cm/s TAPSE (M-mode): 2.0 cm LEFT ATRIUM             Index       RIGHT ATRIUM           Index LA diam:        3.70 cm 1.82 cm/m  RA Area:     16.10 cm LA Vol (A2C):   65.9 ml 32.40 ml/m RA Volume:   39.00 ml  19.17 ml/m LA Vol (A4C):   63.4 ml 31.17 ml/m LA Biplane Vol: 65.6 ml 32.25 ml/m  AORTIC VALVE LVOT Vmax:   77.00 cm/s LVOT Vmean:  55.900 cm/s LVOT VTI:    0.159 m  AORTA Ao Root diam: 3.00 cm Ao Asc diam:  3.40 cm  SHUNTS Systemic VTI:  0.16 m Systemic Diam: 2.00 cm Oswaldo Milian MD Electronically signed by Oswaldo Milian MD Signature Date/Time: 08/17/2020/12:29:50 PM    Final     Microbiology: Recent Results (from the past 240 hour(s))  Resp Panel by RT-PCR (Flu A&B, Covid) Nasopharyngeal Swab     Status: None   Collection Time: 08/16/20  6:55 AM   Specimen: Nasopharyngeal Swab; Nasopharyngeal(NP) swabs in vial transport medium  Result Value Ref Range Status   SARS Coronavirus 2 by RT PCR NEGATIVE NEGATIVE Final    Comment: (NOTE) SARS-CoV-2 target nucleic acids are NOT DETECTED.  The SARS-CoV-2 RNA is generally detectable in upper respiratory specimens during the acute phase of infection. The lowest concentration of SARS-CoV-2 viral copies this assay can detect is 138  copies/mL. A negative result does not preclude SARS-Cov-2 infection and should not be used as the sole basis for treatment or other patient management decisions. A negative result may occur with  improper specimen collection/handling, submission of specimen other than nasopharyngeal swab, presence of viral mutation(s) within the areas targeted by this assay, and inadequate number of viral copies(<138 copies/mL). A negative result must be combined with clinical observations, patient history, and epidemiological information. The expected result is Negative.  Fact Sheet for Patients:  EntrepreneurPulse.com.au  Fact Sheet for Healthcare Providers:  IncredibleEmployment.be  This test is no t yet approved or cleared by the [place] FDA and  has been authorized for detection and/or diagnosis of SARS-CoV-2 by FDA under an Emergency Use Authorization (EUA). This EUA will remain  in effect (meaning this test can be used) for the duration of the COVID-19 declaration under Section 564(b)(1) of the Act, 21 U.S.C.section 360bbb-3(b)(1), unless the authorization is terminated  or revoked sooner.       Influenza A by PCR NEGATIVE NEGATIVE Final   Influenza B by PCR NEGATIVE NEGATIVE Final    Comment: (NOTE) The Xpert Xpress SARS-CoV-2/FLU/RSV plus assay is intended as an aid in the diagnosis of influenza from Nasopharyngeal swab specimens and should not be used as a sole basis for treatment. Nasal washings and aspirates are unacceptable for Xpert Xpress SARS-CoV-2/FLU/RSV testing.  Fact Sheet for Patients: EntrepreneurPulse.com.au  Fact Sheet for Healthcare Providers: IncredibleEmployment.be  This test is not yet approved or cleared by the [place] FDA and has been authorized for detection and/or diagnosis of SARS-CoV-2 by FDA under an Emergency Use Authorization (EUA).  This EUA will remain in effect (meaning  this test can be used) for the duration of the COVID-19 declaration under Section 564(b)(1) of the Act, 21 U.S.C. section 360bbb-3(b)(1), unless the authorization is terminated or revoked.  Performed at [facility], [place] [address], [place], [place] [address]      Labs: Basic Metabolic Panel: Recent Labs  Lab 08/16/20 0655 08/17/20 0359 08/18/20 0421 08/19/20 0424  NA 140 138 138 137  K 3.9 3.6 3.4* 3.8  CL 106 105 104 106  CO2 22 21* 24 22  GLUCOSE 110* 94 110* 113*  BUN 14 15 20  21*  CREATININE 1.11* 1.15* 1.27* 1.28*  CALCIUM 9.1 8.7* 8.9 8.8*   Liver Function Tests: Recent Labs  Lab 08/16/20 0655 08/17/20 0359  AST 25 16  ALT 21 15  ALKPHOS 65 59  BILITOT 0.5 0.6  PROT 7.8 6.4*  ALBUMIN 4.3 3.6   No results for input(s): LIPASE, AMYLASE in the last 168 hours. No results for input(s): AMMONIA in the last 168 hours. CBC: Recent Labs  Lab 08/16/20 0655 08/17/20 0359 08/18/20 0421 08/19/20 0424  WBC 15.1* 11.8* 10.1 9.2  NEUTROABS 12.8*  --   --   --   HGB 14.5 13.0 14.1 14.0  HCT 44.3 39.9 44.1 44.0  MCV 87.4 87.3 88.0 89.6  PLT 259 249 255 244   Cardiac Enzymes: No results for input(s): CKTOTAL, CKMB, CKMBINDEX, TROPONINI in the last 168 hours. BNP: BNP (last 3 results) Recent Labs    08/16/20 0655  BNP 252.4*    ProBNP (last 3 results) No results for input(s): PROBNP in the last 8760 hours.  CBG: No results for input(s): GLUCAP in the last 168 hours.     Signed:  Florencia Reasons MD, PhD, FACP  [facility] 08/19/2020, 2:56 PM

## 2020-08-19 NOTE — Progress Notes (Signed)
AVS given to patient and explained at the bedside. Medications and follow up appointments have been explained with pt verbalizing understanding.  

## 2020-08-20 LAB — URINE CULTURE: Culture: <10000 — AB

## 2020-08-21 LAB — ANA W/REFLEX IF POSITIVE: Anti Nuclear Antibody (ANA): NEGATIVE

## 2020-09-10 ENCOUNTER — Encounter (HOSPITAL_COMMUNITY): Payer: Self-pay

## 2020-09-10 ENCOUNTER — Emergency Department (HOSPITAL_COMMUNITY)
Admission: EM | Admit: 2020-09-10 | Discharge: 2020-09-11 | Disposition: A | Discharge status: Home / Self Care | Payer: Self-pay | Attending: Emergency Medicine | Admitting: Emergency Medicine

## 2020-09-10 ENCOUNTER — Other Ambulatory Visit: Payer: Self-pay

## 2020-09-10 ENCOUNTER — Emergency Department (HOSPITAL_COMMUNITY): Payer: Self-pay

## 2020-09-10 DIAGNOSIS — R0789 Other chest pain: Secondary | ICD-10-CM | POA: Insufficient documentation

## 2020-09-10 DIAGNOSIS — Z7982 Long term (current) use of aspirin: Secondary | ICD-10-CM | POA: Insufficient documentation

## 2020-09-10 DIAGNOSIS — Z79899 Other long term (current) drug therapy: Secondary | ICD-10-CM | POA: Insufficient documentation

## 2020-09-10 DIAGNOSIS — R7989 Other specified abnormal findings of blood chemistry: Secondary | ICD-10-CM | POA: Insufficient documentation

## 2020-09-10 DIAGNOSIS — I10 Essential (primary) hypertension: Secondary | ICD-10-CM | POA: Insufficient documentation

## 2020-09-10 HISTORY — DX: Essential (primary) hypertension: I10

## 2020-09-10 HISTORY — DX: Malignant (primary) neoplasm, unspecified: C80.1

## 2020-09-10 LAB — BASIC METABOLIC PANEL
Anion gap: 11 (ref 5–15)
BUN: 21 mg/dL — ABNORMAL HIGH (ref 6–20)
CO2: 21 mmol/L — ABNORMAL LOW (ref 22–32)
Calcium: 9.4 mg/dL (ref 8.9–10.3)
Chloride: 105 mmol/L (ref 98–111)
Creatinine, Ser: 1.19 mg/dL — ABNORMAL HIGH (ref 0.44–1.00)
GFR, Estimated: 58 mL/min — ABNORMAL LOW (ref >60)
Glucose, Bld: 91 mg/dL (ref 70–99)
Potassium: 4.4 mmol/L (ref 3.5–5.1)
Sodium: 137 mmol/L (ref 135–145)

## 2020-09-10 LAB — I-STAT BETA HCG BLOOD, ED (MC, WL, AP ONLY): I-stat hCG, quantitative: <5 m[IU]/mL (ref <5)

## 2020-09-10 LAB — CBC WITH DIFFERENTIAL/PLATELET
Abs Immature Granulocytes: 0.01 10*3/uL (ref 0.00–0.07)
Basophils Absolute: 0 10*3/uL (ref 0.0–0.1)
Basophils Relative: 0 %
Eosinophils Absolute: 0.1 10*3/uL (ref 0.0–0.5)
Eosinophils Relative: 2 %
HCT: 42.7 % (ref 36.0–46.0)
Hemoglobin: 14 g/dL (ref 12.0–15.0)
Immature Granulocytes: 0 %
Lymphocytes Relative: 17 %
Lymphs Abs: 1 10*3/uL (ref 0.7–4.0)
MCH: 28.6 pg (ref 26.0–34.0)
MCHC: 32.8 g/dL (ref 30.0–36.0)
MCV: 87.3 fL (ref 80.0–100.0)
Monocytes Absolute: 0.5 10*3/uL (ref 0.1–1.0)
Monocytes Relative: 8 %
Neutro Abs: 4.3 10*3/uL (ref 1.7–7.7)
Neutrophils Relative %: 73 %
Platelets: 191 10*3/uL (ref 150–400)
RBC: 4.89 MIL/uL (ref 3.87–5.11)
RDW: 13.7 % (ref 11.5–15.5)
WBC: 5.9 10*3/uL (ref 4.0–10.5)
nRBC: 0 % (ref 0.0–0.2)

## 2020-09-10 LAB — TROPONIN I (HIGH SENSITIVITY): Troponin I (High Sensitivity): 12 ng/L (ref <18)

## 2020-09-10 MED ORDER — LORAZEPAM 2 MG/ML IJ SOLN
0.5000 mg | Freq: Once | INTRAMUSCULAR | Status: AC
  Administered 2020-09-10: 0.5 mg via INTRAVENOUS
  Filled 2020-09-10: qty 1

## 2020-09-10 MED ORDER — ZOLPIDEM TARTRATE 5 MG PO TABS: 5.0000 mg | ORAL_TABLET | Freq: Every evening | ORAL | 0 refills | Status: DC | PRN

## 2020-09-10 MED ORDER — LABETALOL HCL 5 MG/ML IV SOLN
10.0000 mg | Freq: Once | INTRAVENOUS | Status: AC
  Administered 2020-09-10: 10 mg via INTRAVENOUS
  Filled 2020-09-10: qty 4

## 2020-09-10 NOTE — ED Triage Notes (Signed)
Patient reports that she has hospitalized for hypertension on 08/16/21.  Patient states 2 days she was having normal BP's until 2 days ago and her BP was in the 200's sys. Patient c/o feeling dizzy at times.

## 2020-09-10 NOTE — ED Provider Notes (Signed)
Medical screening examination/treatment/procedure(s) were conducted as a shared visit with non-physician practitioner(s) and myself.  I personally evaluated the patient during the encounter.  EKG Interpretation  Date/Time:  Saturday September 10 2020 19:35:42 EST Ventricular Rate:  87 PR Interval:    QRS Duration: 90 QT Interval:  370 QTC Calculation: 446 R Axis:   -18 Text Interpretation: Sinus rhythm Borderline left axis deviation Anteroseptal infarct, old Baseline wander in lead(s) V6 No significant change since last tracing Confirmed by Lacretia Leigh [phone]) on 09/10/2020 7:57:7 PM 45 year old female with history of hypertension presents with increasing blood pressure for the past 48 hours.  Patient states she has had decreased sleep.  States compliance with her antihypertensive.  Creatinine is stable here.  Blood pressure treated with labetalol x2 here.  Some improvement with her blood pressure.  Has cardiology follow-up scheduled in 3 days.   Lacretia Leigh, MD 09/10/20 2245

## 2020-09-10 NOTE — ED Provider Notes (Signed)
[facility]-EMERGENCY DEPT Provider Note   CSN: [id number] Arrival date & time: 09/10/20  1644     History No chief complaint on file.   Rebecca Donovan is a 45 y.o. female history of hypertension, cardiomyopathy, obesity.  Patient presents with a chief complaints of hypertension, lightheadedness and chest pressure.  Patient reports her hypertension began 2 days ago, has been constant and progressively worsening, no alleviation with her hypertension medications.  Patient reports lightheadedness has been present the last 2 days, is constant, has remained the same, and is worse when she is active, no alleviating symptoms.  Patient reports that her chest pressure has been present the last 2 days, it is intermittent, patient denies any pain associated with this, the pressure does not radiate, no alleviating or aggravating factors, no associated nausea, vomiting or diaphoresis.  Patient reports that her blood pressure has been well controlled since being hospitalized for hypertensive urgency December 2021.  Patient reports taking all of her medications as prescribed.  Patient denies any change in diet.  She reports that she avoids salty foods or processed foods.  Patient denies any recent change in weight.  Patient reports that for the last few days she has had trouble sleeping.    Patient denies any fevers, chills, shortness of breath, leg swelling, cough, dysuria, dizziness, syncopal episodes, seizures, weakness, numbness or tingling, neck pain, back pain.   HPI     Past Medical History:  Diagnosis Date  . Cancer (HCC)   . Hypertension     Patient Active Problem List   Diagnosis Date Noted  . Cardiomyopathy (HCC)   . Hypertensive emergency   . SOB (shortness of breath)   . Elevated troponin level not due to acute coronary syndrome   . Community acquired pneumonia   . Pericardial effusion   . Class 3 severe obesity due to excess calories with serious comorbidity and  body mass index (BMI) of 40.0 to 44.9 in adult [facility])   . Elevated brain natriuretic peptide (BNP) level   . Hypertensive urgency 08/16/2020    Past Surgical History:  Procedure Laterality Date  . CERVIX SURGERY       OB History   No obstetric history on file.     Family History  Problem Relation Age of Onset  . Hypertension Mother     Social History   Tobacco Use  . Smoking status: Never Smoker  . Smokeless tobacco: Never Used  Vaping Use  . Vaping Use: Never used  Substance Use Topics  . Alcohol use: Never  . Drug use: Never    Home Medications Prior to Admission medications   Medication Sig Start Date End Date Taking? Authorizing Provider  acetaminophen (TYLENOL) 500 MG tablet Take 1,000 mg by mouth daily as needed (pain).   Yes [provider]  aspirin-acetaminophen-caffeine (EXCEDRIN MIGRAINE) [phone] MG tablet Take 1 tablet by mouth daily as needed (severe headache).   Yes [provider]  atorvastatin (LIPITOR) 40 MG tablet Take 1 tablet (40 mg total) by mouth daily. 08/20/20  Yes Albertine Grates, MD  carvedilol (COREG) 25 MG tablet Take 1 tablet (25 mg total) by mouth 2 (two) times daily with a meal. 08/19/20  Yes Albertine Grates, MD  Homeopathic Products ([facility]) CREA Apply 1 application topically daily as needed (bruises).   Yes [provider]  hydrALAZINE (APRESOLINE) 100 MG tablet Take 1 tablet (100 mg total) by mouth every 8 (eight) hours. 08/19/20  Yes Albertine Grates, MD  losartan (COZAAR) 50 MG tablet Take 1 tablet (50 mg total) by mouth daily. 08/20/20  Yes Florencia Reasons, MD  OVER THE COUNTER MEDICATION Place 1 patch onto the skin daily. Innovative multi vitamin patch   Yes [provider]  spironolactone (ALDACTONE) 25 MG tablet Take 1 tablet (25 mg total) by mouth daily. Patient taking differently: Take 25 mg by mouth every morning. 08/20/20  Yes Florencia Reasons, MD  zolpidem (AMBIEN) 5 MG tablet Take 1 tablet (5 mg total) by mouth at bedtime  as needed for up to 7 doses for sleep. 09/10/20  Yes Loni Beckwith, PA-C    Allergies    Patient has no known allergies.  Review of Systems   Review of Systems  Constitutional: Negative for chills and fever.  Eyes: Negative for visual disturbance.  Respiratory: Negative for cough and shortness of breath.   Cardiovascular: Positive for chest pain. Negative for palpitations and leg swelling.  Gastrointestinal: Negative for abdominal pain, nausea and vomiting.  Genitourinary: Negative for difficulty urinating and dysuria.  Musculoskeletal: Negative for back pain and neck pain.  Skin: Negative for color change and rash.  Neurological: Positive for light-headedness. Negative for dizziness, seizures, syncope, facial asymmetry, speech difficulty, weakness, numbness and headaches.  Psychiatric/Behavioral: Negative for confusion.    Physical Exam Updated Vital Signs BP (!) 146/100   Pulse 79   Temp 98 F (36.7 C) (Oral)   Resp 17   Ht 5\' 2"  (1.575 m)   Wt 106.1 kg   LMP 08/16/2020   SpO2 97%   BMI 42.80 kg/m   Physical Exam Vitals and nursing note reviewed.  Constitutional:      General: She is not in acute distress.    Appearance: She is morbidly obese. She is not ill-appearing, toxic-appearing or diaphoretic.  HENT:     Head: Normocephalic.  Eyes:     General: No scleral icterus.       Right eye: No discharge.        Left eye: No discharge.     Extraocular Movements: Extraocular movements intact.     Pupils: Pupils are equal, round, and reactive to light.  Cardiovascular:     Rate and Rhythm: Normal rate.     Pulses:          Radial pulses are 3+ on the right side and 3+ on the left side.     Heart sounds: Normal heart sounds.  Pulmonary:     Effort: Pulmonary effort is normal. No respiratory distress.     Breath sounds: Normal breath sounds. No stridor. No wheezing, rhonchi or rales.  Chest:     Chest wall: No tenderness.  Musculoskeletal:     Cervical back:  Normal range of motion and neck supple. No rigidity.     Right lower leg: No edema.     Left lower leg: No edema.  Skin:    General: Skin is warm and dry.     Coloration: Skin is not jaundiced or pale.  Neurological:     General: No focal deficit present.     Mental Status: She is alert.     GCS: GCS eye subscore is 4. GCS verbal subscore is 5. GCS motor subscore is 6.     Cranial Nerves: No cranial nerve deficit or facial asymmetry.     Sensory: Sensation is intact.     Motor: No weakness, tremor, seizure activity or pronator drift.     Coordination: Romberg sign negative. Finger-Nose-Finger Test  normal.     Gait: Gait is intact. Gait normal.     Comments: CN II-XII intact, equal grip strength, +5 strength to bilateral upper and lower extremities    Psychiatric:        Behavior: Behavior is cooperative.     ED Results / Procedures / Treatments   Labs (all labs ordered are listed, but only abnormal results are displayed) Labs Reviewed  BASIC METABOLIC PANEL - Abnormal; Notable for the following components:      Result Value   CO2 21 (*)    BUN 21 (*)    Creatinine, Ser 1.19 (*)    GFR, Estimated 58 (*)    All other components within normal limits  CBC WITH DIFFERENTIAL/PLATELET  I-STAT BETA HCG BLOOD, ED ([facility], [facility], [facility] ONLY)  TROPONIN I (HIGH SENSITIVITY)    EKG EKG Interpretation  Date/Time:  Saturday September 10 2020 19:35:42 EST Ventricular Rate:  87 PR Interval:    QRS Duration: 90 QT Interval:  370 QTC Calculation: 446 R Axis:   -18 Text Interpretation: Sinus rhythm Borderline left axis deviation Anteroseptal infarct, old Baseline wander in lead(s) V6 No significant change since last tracing Confirmed by Lacretia Leigh ([id number]) on 09/10/2020 7:41:46 PM   Radiology CT Head Wo Contrast  Result Date: 09/10/2020 CLINICAL DATA:  45 year old female with dizziness. EXAM: CT HEAD WITHOUT CONTRAST TECHNIQUE: Contiguous axial images were obtained from the base of the skull  through the vertex without intravenous contrast. COMPARISON:  None. FINDINGS: Brain: The ventricles and sulci appropriate size for patient's age. The gray-white matter discrimination is preserved. There is no acute intracranial hemorrhage. No mass effect or midline shift no extra-axial fluid collection. Vascular: No hyperdense vessel or unexpected calcification. Skull: Normal. Negative for fracture or focal lesion. Sinuses/Orbits: Mild mucoperiosteal thickening of paranasal sinuses. No air-fluid. The mastoid air cells are clear. Other: None IMPRESSION: Unremarkable noncontrast CT of the brain. Electronically Signed   By: Anner Crete M.D.   On: 09/10/2020 23:42   DG Chest Portable 1 View  Result Date: 09/10/2020 CLINICAL DATA:  Hypertension, dizziness EXAM: PORTABLE CHEST 1 VIEW COMPARISON:  08/16/2020 FINDINGS: The heart size and mediastinal contours are within normal limits. Both lungs are clear. The visualized skeletal structures are unremarkable. IMPRESSION: No active disease. Electronically Signed   By: Randa Ngo M.D.   On: 09/10/2020 20:31    Procedures Procedures (including critical care time)  Medications Ordered in ED Medications  labetalol (NORMODYNE) injection 10 mg (10 mg Intravenous Given 09/10/20 2102)  labetalol (NORMODYNE) injection 10 mg (10 mg Intravenous Given 09/10/20 2200)  LORazepam (ATIVAN) injection 0.5 mg (0.5 mg Intravenous Given 09/10/20 2254)    ED Course  I have reviewed the triage vital signs and the nursing notes.  Pertinent labs & imaging results that were available during my care of the patient were reviewed by me and considered in my medical decision making (see chart for details).    MDM Rules/Calculators/A&P                          Alert 45 year old female in no acute distress, nontoxic appearing.  Presents with a chief complaint of hypertension, lightheadedness, chest pressure.  Patient reports the symptoms have been present over the last 2 days.   Patient reports she is compliant with all of her hypertension medication.    EKG shows sinus rhythm Borderline left axis deviation Anteroseptal infarct, old Baseline wander in lead(s) V6 No  significant change since last tracing.  12. chest x-ray shows no acute cardiopulmonary disease.  Heart score 2.  Less concerning for ACS.    Blood pressure upon arriving in hospital was 203/118.  Upon interview patient's blood pressure was noted to be 183/115.  Patient reports that over the last 2 days she has had intermittent headache, headaches have come on gradually, not maximal at onset, similar to previous headaches she has had.  Patient denies any headache at present.  Patient was given labetalol 10 mg x 2.  Patient is blood pressure noted to be 161/97.  Patient reports that she has not been sleeping well for the last few days, concern that this maybe contributing to her hypertension will provide her with short course of ambien.  Patient states that she has appointment with [facility] cardiovascular on 09/13/20.  CBC is unremarkable.    Patient BUN and creatinine elevated at 21 and 1.19 respectively. Per chart review creatinine appears to be elevated at baseline with values from 1.11-1.28.  No deficits noted on neurologic exam.  Noncontrast head CT ordered.  CT scan was unremarkable.  No orthostatic change noted to account for patient's lightheadedness.  Patient reports that her chest pressure, and lightheadedness have improved.  Will discharge patient and have her follow-up with [facility] cardiovascular and primary care provider.  Discussed results, findings, treatment and follow up. Patient advised of return precautions. Patient verbalized understanding and agreed with plan.  Patient was discussed with and evaluated by Dr. Zenia Resides.   Final Clinical Impression(s) / ED Diagnoses Final diagnoses:  Hypertension, unspecified type    Rx / DC Orders ED Discharge Orders         Ordered    zolpidem (AMBIEN) 5 MG  tablet  At bedtime PRN        09/10/20 2351           Dyann Ruddle 09/11/20 0008    Lacretia Leigh, MD 09/11/20 2250

## 2020-09-10 NOTE — Discharge Instructions (Addendum)
You came to the emergency department to have your high blood pressure.  You received medication to help bring your blood pressure under control.  It is possible that your lack of sleep over the last few days has impacted your blood pressure.  You were given a short course of Ambien to take at night to help you sleep.  Please read attached information on Ambien also known as zolpidem.  Your EKG and lab work were reassuring are not having a heart attack today.  CT scan of your head was unremarkable.  Please make sure you go to your scheduled appointment with [facility].    Get help right away if you: Develop a severe headache or confusion. Have unusual weakness or numbness. You lose consciousness Have severe pain in your chest or abdomen. Vomit repeatedly. Have trouble breathing.

## 2020-09-13 ENCOUNTER — Encounter: Payer: Self-pay | Admitting: Cardiology

## 2020-09-13 ENCOUNTER — Ambulatory Visit: Payer: Self-pay | Admitting: Cardiology

## 2020-09-13 ENCOUNTER — Other Ambulatory Visit: Payer: Self-pay

## 2020-09-13 VITALS — BP 133/86 | HR 81 | Temp 98.3°F | Ht 62.0 in | Wt 234.0 lb

## 2020-09-13 DIAGNOSIS — R072 Precordial pain: Secondary | ICD-10-CM

## 2020-09-13 DIAGNOSIS — I5042 Chronic combined systolic (congestive) and diastolic (congestive) heart failure: Secondary | ICD-10-CM

## 2020-09-13 DIAGNOSIS — I10 Essential (primary) hypertension: Secondary | ICD-10-CM

## 2020-09-13 DIAGNOSIS — E7800 Pure hypercholesterolemia, unspecified: Secondary | ICD-10-CM

## 2020-09-13 DIAGNOSIS — Z6841 Body Mass Index (BMI) 40.0 and over, adult: Secondary | ICD-10-CM

## 2020-09-13 DIAGNOSIS — I3139 Other pericardial effusion (noninflammatory): Secondary | ICD-10-CM

## 2020-09-13 DIAGNOSIS — R9431 Abnormal electrocardiogram [ECG] [EKG]: Secondary | ICD-10-CM

## 2020-09-13 DIAGNOSIS — I313 Pericardial effusion (noninflammatory): Secondary | ICD-10-CM

## 2020-09-13 DIAGNOSIS — I429 Cardiomyopathy, unspecified: Secondary | ICD-10-CM

## 2020-09-13 MED ORDER — CARVEDILOL 25 MG PO TABS
25.0000 mg | ORAL_TABLET | Freq: Two times a day (BID) | ORAL | 0 refills | Status: DC
Start: 2020-09-13 — End: 2020-09-19

## 2020-09-13 MED ORDER — SPIRONOLACTONE 25 MG PO TABS: 25.0000 mg | ORAL_TABLET | Freq: Every morning | ORAL | 0 refills | Status: DC

## 2020-09-13 MED ORDER — HYDRALAZINE HCL 100 MG PO TABS: 100.0000 mg | ORAL_TABLET | Freq: Three times a day (TID) | ORAL | 0 refills | Status: DC

## 2020-09-13 MED ORDER — LOSARTAN POTASSIUM 50 MG PO TABS: 50.0000 mg | ORAL_TABLET | Freq: Every evening | ORAL | 0 refills | Status: DC

## 2020-09-13 MED ORDER — ATORVASTATIN CALCIUM 40 MG PO TABS
40.0000 mg | ORAL_TABLET | Freq: Every day | ORAL | 0 refills | Status: DC
Start: 2020-09-13 — End: 2020-09-19

## 2020-09-13 NOTE — Progress Notes (Signed)
Date:  09/13/2020   ID:  Hiyab Malave, DOB 1975-10-26, MRN [id number]  PCP:  Patient, No Pcp Per  Cardiologist:  Rex Kras, DO, [facility] (established care 08/17/2020)  REQUESTING PHYSICIAN:  Self Referral  Chief Complaint  Patient presents with  . Heart Failure   . Hypertension    HPI  Rebecca Donovan is a 45 y.o. female who presents to the office with a chief complaint of " management of high blood pressure." Patient's past medical history and cardiovascular risk factors include: Asthma, migraines, cardiomyopathy, pericardial effusion, hypertension, obesity due to excess calories  Patient self-referred to the office for management of cardiomyopathy and hypertension after several ER visits.  Patient is accompanied by her boyfriend to him at today's office visit.  Patient provides verbal consent in regards to discussing her medical condition and plan of care in his presence.  Patient was initially seen in consultation during her hospitalization at [facility] in December 2021.  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  Patient's medications were uptitrated.  She was recommended to follow-up as outpatient patient.  In the interim she went back to the hospital last Saturday on September 10, 2020 secondary to elevated blood pressures.  Patient states that her home blood pressures were greater than [id number] mmHg systolic and diastolic blood pressures were around 100 mmHg.  Associated symptoms included lightheadedness and shortness of breath with minimal chest pressure.  She was triaged at [facility] and high sensitive troponin negative x 1 was given labetalol and Ambien. Patient states that Ambien has helped her sleep better and as a result her BP have improved.   Given the recent ER visit she states that chest pressure was substernal, resolved within minutes, not brought on my activities and did not improve w/ rest.  She did not have pain between shoulder blades, no focal deficits or vision changes.   Since hospital discharge she states her SBP ranges between 120-164mmHg and DBP is around 60 mmHg. No blood pressure log for review.   She is requesting refills of her BP medications and Ambien.    FUNCTIONAL STATUS: No family history of premature coronary disease or sudden cardiac death.   ALLERGIES: No Known Allergies  MEDICATION LIST PRIOR TO VISIT: Current Meds  Medication Sig  . acetaminophen (TYLENOL) 500 MG tablet Take 1,000 mg by mouth daily as needed (pain).  Marland Kitchen aspirin-acetaminophen-caffeine (EXCEDRIN MIGRAINE) 250-250-65 MG tablet Take 1 tablet by mouth daily as needed (severe headache).  . Homeopathic Products (ARNICARE ARNICA) CREA Apply 1 application topically daily as needed (bruises).  Marland Kitchen OVER THE COUNTER MEDICATION Place 1 patch onto the skin daily. Innovative multi vitamin patch  . zolpidem (AMBIEN) 5 MG tablet Take 1 tablet (5 mg total) by mouth at bedtime as needed for up to 7 doses for sleep.  . [DISCONTINUED] atorvastatin (LIPITOR) 40 MG tablet Take 1 tablet (40 mg total) by mouth daily.  . [DISCONTINUED] carvedilol (COREG) 25 MG tablet Take 1 tablet (25 mg total) by mouth 2 (two) times daily with a meal.  . [DISCONTINUED] hydrALAZINE (APRESOLINE) 100 MG tablet Take 1 tablet (100 mg total) by mouth every 8 (eight) hours.  . [DISCONTINUED] losartan (COZAAR) 50 MG tablet Take 1 tablet (50 mg total) by mouth daily.  . [DISCONTINUED] spironolactone (ALDACTONE) 25 MG tablet Take 1 tablet (25 mg total) by mouth daily. (Patient taking differently: Take 25 mg by mouth every morning.)  PAST MEDICAL HISTORY: Past Medical History:  Diagnosis Date  . Cancer ([place])   . Hyperlipidemia   . Hypertension     PAST SURGICAL HISTORY: Past Surgical History:  Procedure Laterality Date  . CERVIX SURGERY      FAMILY HISTORY: The patient family history includes Cancer in her paternal  grandfather; Diabetes in her maternal grandfather; Hypertension in her mother.  SOCIAL HISTORY:  The patient  reports that she has never smoked. She has never used smokeless tobacco. She reports that she does not drink alcohol and does not use drugs.  REVIEW OF SYSTEMS: Review of Systems  Constitutional: Negative for chills and fever.  HENT: Negative for hoarse voice and nosebleeds.   Eyes: Negative for discharge, double vision and pain.  Cardiovascular: Negative for chest pain, claudication, dyspnea on exertion, leg swelling, near-syncope, orthopnea, palpitations, paroxysmal nocturnal dyspnea and syncope.  Respiratory: Negative for hemoptysis and shortness of breath.   Musculoskeletal: Negative for muscle cramps and myalgias.  Gastrointestinal: Negative for abdominal pain, constipation, diarrhea, hematemesis, hematochezia, melena, nausea and vomiting.  Neurological: Negative for dizziness and light-headedness.   PHYSICAL EXAM: Vitals with BMI 09/13/2020 09/11/2020 09/11/2020  Height 5\' 2"  - -  Weight 234 lbs - -  BMI [id number] - -  Systolic [id number]  Diastolic 86 92 98  Pulse 81 72 73    CONSTITUTIONAL: Appears older than stated age, well-developed and well-nourished. No acute distress.  SKIN: Skin is warm and dry. No rash noted. No cyanosis. No pallor. No jaundice HEAD: Normocephalic and atraumatic.  EYES: No scleral icterus MOUTH/THROAT: Moist oral membranes.  NECK: No JVD present. No thyromegaly noted. No carotid bruits  LYMPHATIC: No visible cervical adenopathy.  CHEST Normal respiratory effort. No intercostal retractions  LUNGS: Mild expiratory wheezes and rales noted at the bases CARDIOVASCULAR: Regular, positive S1-S2, no murmurs rubs or gallops appreciated. ABDOMINAL: Obese, soft, nontender, distended, positive bowel sounds in all 4 quadrants, no apparent ascites.  EXTREMITIES: No peripheral edema  HEMATOLOGIC: No significant bruising NEUROLOGIC: Oriented to person, place,  and time. Nonfocal. Normal muscle tone.  PSYCHIATRIC: Normal mood and affect. Normal behavior. Cooperative  CARDIAC DATABASE: EKG: 08/17/2020: Normal sinus rhythm, 81 bpm, left axis deviation, poor R wave progression, LVH, ST-T changes in the high lateral and lateral leads possibly secondary to LVH but underlying ischemia cannot be ruled out.  Echocardiogram: 08/17/2020: 1. Left ventricular ejection fraction, by estimation, is 45 to 50%. The  left ventricle has mildly decreased function. The left ventricle  demonstrates global hypokinesis. The left ventricular internal cavity size  was mildly dilated. There is severe left  ventricular hypertrophy. Left ventricular diastolic parameters are  consistent with Grade II diastolic dysfunction (pseudonormalization).  Elevated left atrial pressure.  2. Right ventricular systolic function is normal. The right ventricular  size is normal. Tricuspid regurgitation signal is inadequate for assessing  PA pressure.  3. A small pericardial effusion is present.  4. The mitral valve is normal in structure. No evidence of mitral valve  regurgitation. No evidence of mitral stenosis.  5. The aortic valve was not well visualized. Aortic valve regurgitation  is not visualized.  6. The inferior vena cava is dilated in size with >50% respiratory  variability, suggesting right atrial pressure of 8 mmHg.  LABORATORY DATA: CBC Latest Ref Rng & Units 09/10/2020 08/19/2020 08/18/2020  WBC 4.0 - 10.5 K/uL 5.9 9.2 10.1  Hemoglobin 12.0 - 15.0 g/dL 14.0 14.0 14.1  Hematocrit 36.0 - 46.0 % 42.7 44.0 44.1  Platelets 150 - 400 K/uL 191 244 255    CMP Latest Ref Rng & Units 09/10/2020 08/19/2020 08/18/2020  Glucose 70 - 99 mg/dL 91 113(H) 110(H)  BUN 6 - 20 mg/dL 21(H) 21(H) 20  Creatinine 0.44 - 1.00 mg/dL 1.19(H) 1.28(H) 1.27(H)  Sodium 135 - 145 mmol/L 137 137 138  Potassium 3.5 - 5.1 mmol/L 4.4 3.8 3.4(L)  Chloride 98 - 111 mmol/L 105 106 104  CO2 22 -  32 mmol/L 21(L) 22 24  Calcium 8.9 - 10.3 mg/dL 9.4 8.8(L) 8.9  Total Protein 6.5 - 8.1 g/dL - - -  Total Bilirubin 0.3 - 1.2 mg/dL - - -  Alkaline Phos 38 - 126 U/L - - -  AST 15 - 41 U/L - - -  ALT 0 - 44 U/L - - -    Lipid Panel     Component Value Date/Time   CHOL 216 (H) 08/18/2020 0421   TRIG 168 (H) 08/18/2020 0421   HDL 46 08/18/2020 0421   CHOLHDL 4.7 08/18/2020 0421   VLDL 34 08/18/2020 0421   LDLCALC 136 (H) 08/18/2020 0421    No components found for: NTPROBNP No results for input(s): PROBNP in the last 8760 hours. Recent Labs    08/18/20 0421  TSH 5.218*    BMP Recent Labs    08/18/20 0421 08/19/20 0424 09/10/20 1756  NA 138 137 137  K 3.4* 3.8 4.4  CL 104 106 105  CO2 24 22 21*  GLUCOSE 110* 113* 91  BUN 20 21* 21*  CREATININE 1.27* 1.28* 1.19*  CALCIUM 8.9 8.8* 9.4  GFRNONAA 54* 53* 58*    HEMOGLOBIN A1C Lab Results  Component Value Date   HGBA1C 5.1 08/18/2020   MPG 99.67 08/18/2020    IMPRESSION:    ICD-10-CM   1. Chronic combined systolic and diastolic congestive heart failure (HCC)  I50.42 EKG 12-Lead    spironolactone (ALDACTONE) 25 MG tablet    losartan (COZAAR) 50 MG tablet    hydrALAZINE (APRESOLINE) 100 MG tablet    carvedilol (COREG) 25 MG tablet    PCV MYOCARDIAL PERFUSION WO LEXISCAN  2. Cardiomyopathy, unspecified type ([place])  I42.9 spironolactone (ALDACTONE) 25 MG tablet    losartan (COZAAR) 50 MG tablet    hydrALAZINE (APRESOLINE) 100 MG tablet    carvedilol (COREG) 25 MG tablet  3. Benign hypertension  I10   4. Hypercholesterolemia  E78.00 atorvastatin (LIPITOR) 40 MG tablet  5. Pericardial effusion  I31.3   6. Class 3 severe obesity due to excess calories with serious comorbidity and body mass index (BMI) of 40.0 to 44.9 in adult (HCC)  E66.01    Z68.41   7. Abnormal EKG  R94.31 PCV MYOCARDIAL PERFUSION WO LEXISCAN    SARS-COV-2 RNA,(COVID-19) QUAL NAAT  8. Precordial pain  R07.2 PCV MYOCARDIAL PERFUSION WO LEXISCAN     SARS-COV-2 RNA,(COVID-19) QUAL NAAT     RECOMMENDATIONS: Rebecca Donovan is a 44 y.o. female whose past medical history and cardiac risk factors include: Asthma, migraines, cardiomyopathy, chronic combined systolic and diastolic heart failure, Pericardial effusion, hypertension, obesity due to excess calories.  Chronic combined systolic and diastolic heart failure: Most recent echocardiogram noted an LVEF that is mildly reduced and also has grade 2 diastolic impairment. I suspect that her underlying cardiomyopathy is nonischemic due to hypertensive heart disease. Refilled spironolactone, losartan, hydralazine, carvedilol. Most recent labs from 09/10/2020 independently reviewed. Educated on the importance of risk factor modifications.  Cardiomyopathy: Suspect that her underlying cardiomyopathy is  nonischemic due to hypertensive heart disease. EKG shows normal sinus rhythm but does have ST T changes at baseline in the high lateral leads suggestive of possible ischemia. Given the EKG changes at baseline would recommend exercise nuclear stress test to rule out underlying ischemia. Covid screen prior to stress testing.  Benign essential hypertension: Office blood pressures well controlled. However, patient has had multiple ER visits for uncontrolled hypertension.  She denies the use of illicit drugs.  However, during her last hospitalization UDS was positive for marijuana. Most recent labs independently reviewed. Antihypertensive medications refilled. Patient is asked to keep a log of her blood pressures and to bring it in at the next visit. Educated on the importance of a low-salt diet  Hypercholesterolemia: Most recent lipid profile from 08/19/2019 were independently reviewed. LDL was 136 mg/dL.  Prior to discharge from hospitalization she was started on atorvastatin. Check fasting lipid profile prior to next office visit.  Patient is strongly encouraged to establish care with a primary  care provider given her multiple comorbidities.  Patient states that she has insurance as of September 20, 2020 and will establish care with PCP.    FINAL MEDICATION LIST END OF ENCOUNTER: Meds ordered this encounter  Medications  . spironolactone (ALDACTONE) 25 MG tablet    Sig: Take 1 tablet (25 mg total) by mouth every morning.    Dispense:  90 tablet    Refill:  0  . losartan (COZAAR) 50 MG tablet    Sig: Take 1 tablet (50 mg total) by mouth every evening.    Dispense:  90 tablet    Refill:  0  . hydrALAZINE (APRESOLINE) 100 MG tablet    Sig: Take 1 tablet (100 mg total) by mouth every 8 (eight) hours.    Dispense:  270 tablet    Refill:  0  . carvedilol (COREG) 25 MG tablet    Sig: Take 1 tablet (25 mg total) by mouth 2 (two) times daily with a meal.    Dispense:  180 tablet    Refill:  0  . atorvastatin (LIPITOR) 40 MG tablet    Sig: Take 1 tablet (40 mg total) by mouth at bedtime.    Dispense:  90 tablet    Refill:  0    Medications Discontinued During This Encounter  Medication Reason  . atorvastatin (LIPITOR) 40 MG tablet Reorder  . carvedilol (COREG) 25 MG tablet Reorder  . hydrALAZINE (APRESOLINE) 100 MG tablet Reorder  . losartan (COZAAR) 50 MG tablet Reorder  . spironolactone (ALDACTONE) 25 MG tablet Reorder     Current Outpatient Medications:  .  acetaminophen (TYLENOL) 500 MG tablet, Take 1,000 mg by mouth daily as needed (pain)., Disp: , Rfl:  .  aspirin-acetaminophen-caffeine (EXCEDRIN MIGRAINE) 250-250-65 MG tablet, Take 1 tablet by mouth daily as needed (severe headache)., Disp: , Rfl:  .  Homeopathic Products (ARNICARE ARNICA) CREA, Apply 1 application topically daily as needed (bruises)., Disp: , Rfl:  .  OVER THE COUNTER MEDICATION, Place 1 patch onto the skin daily. Innovative multi vitamin patch, Disp: , Rfl:  .  zolpidem (AMBIEN) 5 MG tablet, Take 1 tablet (5 mg total) by mouth at bedtime as needed for up to 7 doses for sleep., Disp: 7 tablet, Rfl: 0 .   atorvastatin (LIPITOR) 40 MG tablet, Take 1 tablet (40 mg total) by mouth at bedtime., Disp: 90 tablet, Rfl: 0 .  carvedilol (COREG) 25 MG tablet, Take 1 tablet (25 mg total) by mouth 2 (two)  times daily with a meal., Disp: 180 tablet, Rfl: 0 .  hydrALAZINE (APRESOLINE) 100 MG tablet, Take 1 tablet (100 mg total) by mouth every 8 (eight) hours., Disp: 270 tablet, Rfl: 0 .  losartan (COZAAR) 50 MG tablet, Take 1 tablet (50 mg total) by mouth every evening., Disp: 90 tablet, Rfl: 0 .  spironolactone (ALDACTONE) 25 MG tablet, Take 1 tablet (25 mg total) by mouth every morning., Disp: 90 tablet, Rfl: 0  Orders Placed This Encounter  Procedures  . SARS-COV-2 RNA,(COVID-19) QUAL NAAT  . PCV MYOCARDIAL PERFUSION WO LEXISCAN  . EKG 12-Lead    There are no Patient Instructions on file for this visit.   --Continue cardiac medications as reconciled in final medication list. --Return in about 4 weeks (around 10/11/2020) for Follow up, BP. Or sooner if needed. --Continue follow-up with your primary care physician regarding the management of your other chronic comorbid conditions.  Patient's questions and concerns were addressed to her satisfaction. She voices understanding of the instructions provided during this encounter.   This note was created using a voice recognition software as a result there may be grammatical errors inadvertently enclosed that do not reflect the nature of this encounter. Every attempt is made to correct such errors.  Rex Kras, [place], [facility]  Pager: [phone] Office: [phone]

## 2020-09-19 ENCOUNTER — Other Ambulatory Visit: Payer: Self-pay

## 2020-09-19 DIAGNOSIS — E7800 Pure hypercholesterolemia, unspecified: Secondary | ICD-10-CM

## 2020-09-19 DIAGNOSIS — I429 Cardiomyopathy, unspecified: Secondary | ICD-10-CM

## 2020-09-19 DIAGNOSIS — I5042 Chronic combined systolic (congestive) and diastolic (congestive) heart failure: Secondary | ICD-10-CM

## 2020-09-19 MED ORDER — HYDRALAZINE HCL 100 MG PO TABS: 100.0000 mg | ORAL_TABLET | Freq: Three times a day (TID) | ORAL | 0 refills | Status: DC

## 2020-09-19 MED ORDER — LOSARTAN POTASSIUM 50 MG PO TABS: 50.0000 mg | ORAL_TABLET | Freq: Every evening | ORAL | 0 refills | Status: DC

## 2020-09-19 MED ORDER — ATORVASTATIN CALCIUM 40 MG PO TABS: 40.0000 mg | ORAL_TABLET | Freq: Every day | ORAL | 0 refills | Status: DC

## 2020-09-19 MED ORDER — CARVEDILOL 25 MG PO TABS: 25.0000 mg | ORAL_TABLET | Freq: Two times a day (BID) | ORAL | 0 refills | Status: DC

## 2020-09-19 MED ORDER — SPIRONOLACTONE 25 MG PO TABS: 25.0000 mg | ORAL_TABLET | Freq: Every morning | ORAL | 0 refills | Status: DC

## 2020-09-26 ENCOUNTER — Other Ambulatory Visit (HOSPITAL_COMMUNITY): Payer: Self-pay

## 2020-09-28 ENCOUNTER — Other Ambulatory Visit: Payer: Self-pay

## 2020-10-11 ENCOUNTER — Ambulatory Visit: Payer: Self-pay | Admitting: Cardiology

## 2020-10-12 ENCOUNTER — Encounter (HOSPITAL_COMMUNITY): Payer: Self-pay

## 2020-10-12 ENCOUNTER — Emergency Department (HOSPITAL_COMMUNITY): Payer: 59

## 2020-10-12 ENCOUNTER — Other Ambulatory Visit: Payer: Self-pay

## 2020-10-12 ENCOUNTER — Emergency Department (HOSPITAL_COMMUNITY)
Admission: EM | Admit: 2020-10-12 | Discharge: 2020-10-12 | Disposition: A | Discharge status: Home / Self Care | Payer: 59 | Attending: Emergency Medicine | Admitting: Emergency Medicine

## 2020-10-12 DIAGNOSIS — R079 Chest pain, unspecified: Secondary | ICD-10-CM | POA: Diagnosis present

## 2020-10-12 DIAGNOSIS — Z79899 Other long term (current) drug therapy: Secondary | ICD-10-CM | POA: Insufficient documentation

## 2020-10-12 DIAGNOSIS — I10 Essential (primary) hypertension: Secondary | ICD-10-CM

## 2020-10-12 DIAGNOSIS — I110 Hypertensive heart disease with heart failure: Secondary | ICD-10-CM | POA: Diagnosis not present

## 2020-10-12 DIAGNOSIS — I5040 Unspecified combined systolic (congestive) and diastolic (congestive) heart failure: Secondary | ICD-10-CM | POA: Diagnosis not present

## 2020-10-12 DIAGNOSIS — Z859 Personal history of malignant neoplasm, unspecified: Secondary | ICD-10-CM | POA: Diagnosis not present

## 2020-10-12 LAB — CBC
HCT: 37.3 % (ref 36.0–46.0)
Hemoglobin: 12.3 g/dL (ref 12.0–15.0)
MCH: 28.9 pg (ref 26.0–34.0)
MCHC: 33 g/dL (ref 30.0–36.0)
MCV: 87.6 fL (ref 80.0–100.0)
Platelets: 185 10*3/uL (ref 150–400)
RBC: 4.26 MIL/uL (ref 3.87–5.11)
RDW: 14.3 % (ref 11.5–15.5)
WBC: 6.5 10*3/uL (ref 4.0–10.5)
nRBC: 0 % (ref 0.0–0.2)

## 2020-10-12 LAB — D-DIMER, QUANTITATIVE: D-Dimer, Quant: 0.41 ug/mL-FEU (ref 0.00–0.50)

## 2020-10-12 LAB — BASIC METABOLIC PANEL
Anion gap: 11 (ref 5–15)
BUN: 21 mg/dL — ABNORMAL HIGH (ref 6–20)
CO2: 21 mmol/L — ABNORMAL LOW (ref 22–32)
Calcium: 9.2 mg/dL (ref 8.9–10.3)
Chloride: 105 mmol/L (ref 98–111)
Creatinine, Ser: 1.11 mg/dL — ABNORMAL HIGH (ref 0.44–1.00)
GFR, Estimated: >60 mL/min (ref >60)
Glucose, Bld: 96 mg/dL (ref 70–99)
Potassium: 4.2 mmol/L (ref 3.5–5.1)
Sodium: 137 mmol/L (ref 135–145)

## 2020-10-12 LAB — TROPONIN I (HIGH SENSITIVITY)
Troponin I (High Sensitivity): 10 ng/L (ref <18)
Troponin I (High Sensitivity): 7 ng/L (ref <18)

## 2020-10-12 LAB — I-STAT BETA HCG BLOOD, ED (NOT ORDERABLE): I-stat hCG, quantitative: <5 m[IU]/mL (ref <5)

## 2020-10-12 LAB — TSH: TSH: 2.062 u[IU]/mL (ref 0.350–4.500)

## 2020-10-12 MED ORDER — ASPIRIN 81 MG PO CHEW
324.0000 mg | CHEWABLE_TABLET | Freq: Once | ORAL | Status: AC
  Administered 2020-10-12: 324 mg via ORAL
  Filled 2020-10-12: qty 4

## 2020-10-12 MED ORDER — NITROGLYCERIN 0.4 MG SL SUBL
0.4000 mg | SUBLINGUAL_TABLET | SUBLINGUAL | Status: DC | PRN
  Administered 2020-10-12: 0.4 mg via SUBLINGUAL
  Filled 2020-10-12: qty 1

## 2020-10-12 NOTE — ED Provider Notes (Signed)
[facility] DEPT Provider Note   CSN: [id number] Arrival date & time: 10/12/20  0203    History Chief Complaint  Patient presents with  . Chest Pain    Rebecca Donovan is a 45 y.o. female.  The history is provided by the patient.  Chest Pain She has history of hypertension, hyperlipidemia, combined systolic and diastolic heart failure and comes in because of chest pain.  Pain is in the left upper lateral chest and she describes it as a dull pain with some radiation to the back.  It is worse with taking a deep breath.  Pain was initially rated at 8/10, but has subsided to where it is now 2/10.  She denies dyspnea, nausea, diaphoresis.  She has not taken anything for the pain.  She has checked her blood pressure and it has been quite high with readings as high as 742 systolic.  She is starting her menses and states her blood pressure always goes up with the onset of her menses, generally is in the normal range the rest of the month.  She states that she has been compliant with taking all of her blood pressure medications.  She is a non-smoker and there is no history of diabetes and there is no family history of premature coronary atherosclerosis.  She denies history of travel, cancer, oral contraceptive use.  She is wondering if she might have some thyroid issues relating to her blood pressure.  Past Medical History:  Diagnosis Date  . Cancer ([place])   . Hyperlipidemia   . Hypertension     Patient Active Problem List   Diagnosis Date Noted  . Cardiomyopathy ([place])   . Hypertensive emergency   . SOB (shortness of breath)   . Elevated troponin level not due to acute coronary syndrome   . Community acquired pneumonia   . Pericardial effusion   . Class 3 severe obesity due to excess calories with serious comorbidity and body mass index (BMI) of 40.0 to 44.9 in adult [facility])   . Elevated brain natriuretic peptide (BNP) level   . Hypertensive urgency 08/16/2020     Past Surgical History:  Procedure Laterality Date  . CERVIX SURGERY       OB History   No obstetric history on file.     Family History  Problem Relation Age of Onset  . Hypertension Mother   . Diabetes Maternal Grandfather   . Cancer Paternal Grandfather     Social History   Tobacco Use  . Smoking status: Never Smoker  . Smokeless tobacco: Never Used  Vaping Use  . Vaping Use: Never used  Substance Use Topics  . Alcohol use: Never  . Drug use: Never    Home Medications Prior to Admission medications   Medication Sig Start Date End Date Taking? Authorizing Provider  acetaminophen (TYLENOL) 500 MG tablet Take 1,000 mg by mouth daily as needed (pain).    [provider]  aspirin-acetaminophen-caffeine (EXCEDRIN MIGRAINE) [phone] MG tablet Take 1 tablet by mouth daily as needed (severe headache).    [provider]  atorvastatin (LIPITOR) 40 MG tablet Take 1 tablet (40 mg total) by mouth at bedtime. 09/19/20 12/18/20  Tolia, Sunit, DO  carvedilol (COREG) 25 MG tablet Take 1 tablet (25 mg total) by mouth 2 (two) times daily with a meal. 09/19/20 12/18/20  Tolia, Sunit, DO  Homeopathic Products (ARNICARE ARNICA) CREA Apply 1 application topically daily as needed (bruises).    [provider]  hydrALAZINE (  APRESOLINE) 100 MG tablet Take 1 tablet (100 mg total) by mouth every 8 (eight) hours. 09/19/20 12/18/20  Tolia, Sunit, DO  losartan (COZAAR) 50 MG tablet Take 1 tablet (50 mg total) by mouth every evening. 09/19/20 12/18/20  Tolia, Sunit, DO  OVER THE COUNTER MEDICATION Place 1 patch onto the skin daily. Innovative multi vitamin patch    [provider]  spironolactone (ALDACTONE) 25 MG tablet Take 1 tablet (25 mg total) by mouth every morning. 09/19/20 12/18/20  Tolia, Sunit, DO  zolpidem (AMBIEN) 5 MG tablet Take 1 tablet (5 mg total) by mouth at bedtime as needed for up to 7 doses for sleep. 09/10/20   Loni Beckwith, PA-C    Allergies     Patient has no known allergies.  Review of Systems   Review of Systems  Cardiovascular: Positive for chest pain.  All other systems reviewed and are negative.   Physical Exam Updated Vital Signs BP (!) 175/106   Pulse 80   Temp (!) 97.5 F (36.4 C) (Oral)   Resp 19   Ht 5\' 2"  (1.575 m)   Wt 106.1 kg   LMP  (LMP Unknown)   SpO2 99%   BMI 42.80 kg/m   Physical Exam Vitals and nursing note reviewed.   45 year old female, resting comfortably and in no acute distress. Vital signs are significant for elevated blood pressure. Oxygen saturation is 99%, which is normal. Head is normocephalic and atraumatic. PERRLA, EOMI. Oropharynx is clear. Neck is nontender and supple without adenopathy or JVD. Back is nontender and there is no CVA tenderness. Lungs are clear without rales, wheezes, or rhonchi. Chest is nontender. Heart has regular rate and rhythm without murmur. Abdomen is soft, flat, nontender without masses or hepatosplenomegaly and peristalsis is normoactive. Extremities have no cyanosis or edema, full range of motion is present. Skin is warm and dry without rash. Neurologic: Mental status is normal, cranial nerves are intact, there are no motor or sensory deficits.  ED Results / Procedures / Treatments   Labs (all labs ordered are listed, but only abnormal results are displayed) Labs Reviewed  BASIC METABOLIC PANEL - Abnormal; Notable for the following components:      Result Value   CO2 21 (*)    BUN 21 (*)    Creatinine, Ser 1.11 (*)    All other components within normal limits  CBC  D-DIMER, QUANTITATIVE  TSH  I-STAT BETA HCG BLOOD, ED ([facility], [facility], [facility] ONLY)  I-STAT BETA HCG BLOOD, ED (NOT ORDERABLE)  TROPONIN I (HIGH SENSITIVITY)  TROPONIN I (HIGH SENSITIVITY)    EKG EKG Interpretation  Date/Time:  Wednesday October 12 2020 02:12:41 EST Ventricular Rate:  83 PR Interval:    QRS Duration: 88 QT Interval:  375 QTC Calculation: 441 R Axis:   -36 Text  Interpretation: Sinus rhythm Left axis deviation Nonspecific T abnormalities, lateral leads When compared with ECG of 09/10/2020, No significant change was found Confirmed by Delora Fuel ([id number]) on 10/12/2020 2:27:01 AM   Radiology DG Chest 2 View  Result Date: 10/12/2020 CLINICAL DATA:  Chest pain which began 1 hour prior, radiating to back, elevated blood pressure EXAM: CHEST - 2 VIEW COMPARISON:  Radiograph 09/10/2020, CT 08/08/2020 FINDINGS: No consolidation, features of edema, pneumothorax, or effusion. Pulmonary vascularity is normally distributed. The cardiomediastinal contours are unremarkable. No acute osseous or soft tissue abnormality. IMPRESSION: No acute cardiopulmonary abnormality. Electronically Signed   By: Lovena Le M.D.   On: 10/12/2020 02:49  Procedures Procedures   Medications Ordered in ED Medications  nitroGLYCERIN (NITROSTAT) SL tablet 0.4 mg (0.4 mg Sublingual Given 10/12/20 0644)  aspirin chewable tablet 324 mg (324 mg Oral Given 10/12/20 0645)    ED Course  I have reviewed the triage vital signs and the nursing notes.  Pertinent labs & imaging results that were available during my care of the patient were reviewed by me and considered in my medical decision making (see chart for details).  MDM Rules/Calculators/A&P Chest pain which is somewhat atypical.  Pleuritic component does bring pulmonary embolism is a possibility.  ECG is unchanged from baseline and troponin is normal making ACS unlikely.  Will need to get a second troponin.  Chest x-ray shows no evidence of pneumonia.  No risk factors for aortic dissection.  Will screen for pulmonary embolism with D-dimer.  Old records are reviewed confirming recent ED visit and hospitalization for poorly controlled hypertension, and outpatient management of blood pressure by cardiology.  Above-noted labs are still pending.  Case is signed out to Dr. Regenia Skeeter.  Final Clinical Impression(s) / ED Diagnoses Final  diagnoses:  Nonspecific chest pain  Elevated blood pressure reading with diagnosis of hypertension    Rx / DC Orders ED Discharge Orders    None       Delora Fuel, MD 21/11/73 [phone]

## 2020-10-12 NOTE — ED Provider Notes (Signed)
Care transferred to me.  Troponin, TSH, and D-dimer are all negative.  Per prior plan will discharge home to follow-up with her cardiologist.  Return precautions given.   Sherwood Gambler, MD 10/12/20 [phone]

## 2020-10-12 NOTE — ED Triage Notes (Signed)
Pt sts sternal chest pains that radiate to the back beginning 1 hour prior to arrival. Pt sts her BP has been increased since yesterday. Currently on 5 different medications. Pt suspects HTN is related to her beginning menstrual cycle.

## 2020-10-12 NOTE — Discharge Instructions (Signed)
If you develop recurrent, continued, or worsening chest pain, shortness of breath, fever, vomiting, abdominal or back pain, or any other new/concerning symptoms then return to the ER for evaluation.  

## 2021-04-06 ENCOUNTER — Ambulatory Visit: Payer: 59 | Attending: Internal Medicine | Admitting: Internal Medicine

## 2021-04-06 ENCOUNTER — Encounter: Payer: Self-pay | Admitting: Internal Medicine

## 2021-04-06 ENCOUNTER — Other Ambulatory Visit: Payer: Self-pay

## 2021-04-06 VITALS — BP 195/147 | HR 110 | Resp 16 | Ht 62.0 in | Wt 275.0 lb

## 2021-04-06 DIAGNOSIS — F411 Generalized anxiety disorder: Secondary | ICD-10-CM | POA: Insufficient documentation

## 2021-04-06 DIAGNOSIS — F5105 Insomnia due to other mental disorder: Secondary | ICD-10-CM | POA: Insufficient documentation

## 2021-04-06 DIAGNOSIS — F321 Major depressive disorder, single episode, moderate: Secondary | ICD-10-CM

## 2021-04-06 DIAGNOSIS — Z7689 Persons encountering health services in other specified circumstances: Secondary | ICD-10-CM

## 2021-04-06 DIAGNOSIS — I5042 Chronic combined systolic (congestive) and diastolic (congestive) heart failure: Secondary | ICD-10-CM

## 2021-04-06 DIAGNOSIS — I5043 Acute on chronic combined systolic (congestive) and diastolic (congestive) heart failure: Secondary | ICD-10-CM | POA: Insufficient documentation

## 2021-04-06 DIAGNOSIS — I10 Essential (primary) hypertension: Secondary | ICD-10-CM | POA: Insufficient documentation

## 2021-04-06 DIAGNOSIS — F99 Mental disorder, not otherwise specified: Secondary | ICD-10-CM

## 2021-04-06 MED ORDER — SERTRALINE HCL 50 MG PO TABS: ORAL_TABLET | ORAL | 1 refills | Status: DC

## 2021-04-06 MED ORDER — CARVEDILOL 25 MG PO TABS: 25.0000 mg | ORAL_TABLET | Freq: Two times a day (BID) | ORAL | 1 refills | Status: DC

## 2021-04-06 MED ORDER — LOSARTAN POTASSIUM 50 MG PO TABS: 50.0000 mg | ORAL_TABLET | Freq: Every evening | ORAL | 1 refills | Status: DC

## 2021-04-06 MED ORDER — SPIRONOLACTONE 25 MG PO TABS: 25.0000 mg | ORAL_TABLET | Freq: Every morning | ORAL | 1 refills | Status: DC

## 2021-04-06 MED ORDER — HYDRALAZINE HCL 100 MG PO TABS: 100.0000 mg | ORAL_TABLET | Freq: Three times a day (TID) | ORAL | 1 refills | Status: DC

## 2021-04-06 MED ORDER — AMLODIPINE BESYLATE 5 MG PO TABS: 5.0000 mg | ORAL_TABLET | Freq: Every day | ORAL | 5 refills | Status: DC

## 2021-04-06 NOTE — Patient Instructions (Signed)
Continue to check your blood pressure daily.  The goal is 130/80 or lower.  We have added another blood pressure medication called amlodipine to take once a day.  We have referred you to medical weight management.  Start the Zoloft to help with depression and anxiety.  We have submitted a referral to behavioral health.

## 2021-04-06 NOTE — Progress Notes (Signed)
Pt states her bp is only high when she is on her menstrual

## 2021-04-06 NOTE — Progress Notes (Signed)
Patient ID: Rebecca Donovan, female    DOB: 1976-07-11  MRN: [id number]  CC: New Patient (Initial Visit) and Hypertension   Subjective: Rebecca Donovan is a 45 y.o. 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  Currently on carvedilol, hydralazine 100 mg 3 times a day, Cozaar 50 mg daily and spironolactone 25 mg daily. She was hospitalized in December of last year with hypertensive emergency.  Found to have acute systolic and diastolic CHF.  Echo showed EF of 45-50% with global hypokinesis, severe LVH and grade 2 diastolic dysfunction.  Also noted to have pericardial effusion.  ANA C-reactive protein and sed rates were normal.  She saw her cardiologist at [facility] cardiovascular Dr. Terri Skains in January of this year.  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  He had ordered a nuclear stress test for look like patient did not have this done. Med Adherence: '[x]'$  Yes    '[]'$  No Medication side effects: '[]'$  Yes    '[x]'$  No Adherence with salt restriction: '[x]'$  Yes    '[]'$  No Home Monitoring?: '[x]'$  Yes    '[]'$  No Monitoring Frequency: Twice a day Home BP results range: 120-140/80-90.  She reports that her blood pressure always runs very high when she is on her menstrual cycle and she is on her cycle today. SOB? '[]'$  Yes    '[x]'$  No Chest Pain?: '[]'$  Yes    '[x]'$  No Leg swelling?: '[]'$  Yes    '[x]'$  No Headaches?: '[x]'$  Yes when on menses    '[]'$  No Dizziness? '[]'$  Yes    '[x]'$  No Comments: She was seen by [facility] cardiovascular Dr. Terri Skains  HL:  took Lipitor for 1 mth.  Did research and does not want to take it.  Want to work on diet  Obesity: was doing keto diet for a couple yrs and had gotten down to 195 pounds.  He went through a rough patch relationship last  year and fell off the wagon.  However she restarted doing the keto diet again.  At the time that she was hospitalized in December she was 228 pounds.  She tells me she was told on discharge from the hospital to stop the keto diet until she was able to follow-up with the PCP.  She has gained 47 pounds since then. She desperately wants to get her weight down.  She feels she does well with her eating habits.  She drinks mainly water and tea that is not sweet.  Does not eat red meat.  Eats very little white carbs.  On average she is getting in about 10,000 steps per day and uses a weighted hula hoop daily.  She also walks her dog twice a day.  She endorses depression and anxiety.  She feels her weight is negatively impacting her mental health.  Depression/anxiety: Complains of having bad anxiety.  She has problems sleeping more than 3 hours at nights because of it.  Reports she was given Ambien when she was discharged from the hospital and still was not able to sleep with that.  She has tried melatonin in the past.  She states melatonin makes her feel crazy.  She is depressed about her weight.  Gives previous history of depression and anxiety for which she was on Xanax and Zoloft  a long time ago.  She was seeing a behavioral health specialist when she lived in [place] more than 9 years ago.  The suicidal ideation at this time.  Patient Active Problem List   Diagnosis Date Noted   Cardiomyopathy [facility])    Hypertensive emergency    SOB (shortness of breath)    Elevated troponin level not due to acute coronary syndrome    Community acquired pneumonia    Pericardial effusion    Class 3 severe obesity due to excess calories with serious comorbidity and body mass index (BMI) of 40.0 to 44.9 in adult [facility])    Elevated brain natriuretic peptide (BNP) level    Hypertensive urgency 08/16/2020     Current Outpatient Medications on File Prior to Visit  Medication Sig Dispense Refill   acetaminophen (TYLENOL)  500 MG tablet Take 1,000 mg by mouth daily as needed (pain).     aspirin-acetaminophen-caffeine (EXCEDRIN MIGRAINE) 250-250-65 MG tablet Take 1 tablet by mouth daily as needed (severe headache).     atorvastatin (LIPITOR) 40 MG tablet Take 1 tablet (40 mg total) by mouth at bedtime. 90 tablet 0   Homeopathic Products (ARNICARE ARNICA) CREA Apply 1 application topically daily as needed (bruises).     OVER THE COUNTER MEDICATION Place 1 patch onto the skin daily. Innovative multi vitamin patch     No current facility-administered medications on file prior to visit.    No Known Allergies  Social History   Socioeconomic History   Marital status: Divorced    Spouse name: Not on file   Number of children: Not on file   Years of education: Not on file   Highest education level: Not on file  Occupational History   Not on file  Tobacco Use   Smoking status: Never   Smokeless tobacco: Never  Vaping Use   Vaping Use: Never used  Substance and Sexual Activity   Alcohol use: Never   Drug use: Never   Sexual activity: Not on file  Other Topics Concern   Not on file  Social History Narrative   Not on file   Social Determinants of Health   Financial Resource Strain: Not on file  Food Insecurity: Not on file  Transportation Needs: Not on file  Physical Activity: Not on file  Stress: Not on file  Social Connections: Not on file  Intimate Partner Violence: Not on file    Family History  Problem Relation Age of Onset   Hypertension Mother    Diabetes Maternal Grandfather    Cancer Paternal Grandfather     Past Surgical History:  Procedure Laterality Date   CERVIX SURGERY      ROS: Review of Systems Negative except as stated above  PHYSICAL EXAM: BP (!) 195/147   Pulse (!) 110   Resp 16   Ht '5\' 2"'$  (1.575 m)   Wt 275 lb (124.7 kg)   LMP 04/02/2021   SpO2 95%   BMI 50.30 kg/m   Wt Readings from Last 3 Encounters:  04/06/21 275 lb (124.7 kg)  10/12/20 234 lb (106.1  kg)  09/13/20 234 lb (106.1 kg)    Physical Exam  General appearance - alert, well appearing, obese middle-age Caucasian female and in no distress Mental status - normal mood, behavior, speech, dress, motor activity, and thought processes.  Patient became tearful when talking about her weight and how it impacts her mental health Eyes - pupils equal and reactive, extraocular eye movements intact Neck - supple, no  significant adenopathy Chest - clear to auscultation, no wheezes, rales or rhonchi, symmetric air entry Heart - normal rate, regular rhythm, normal S1, S2, no murmurs, rubs, clicks or gallops Extremities - peripheral pulses normal, no pedal edema, no clubbing or cyanosis   Depression screen PHQ 2/9 04/06/2021  Decreased Interest 1  Down, Depressed, Hopeless 1  PHQ - 2 Score 2  Altered sleeping 3  Tired, decreased energy 3  Change in appetite 2  Feeling bad or failure about yourself  3  Trouble concentrating 2  Moving slowly or fidgety/restless 1  Suicidal thoughts 0  PHQ-9 Score 16   GAD 7 : Generalized Anxiety Score 04/06/2021  Nervous, Anxious, on Edge 3  Control/stop worrying 3  Worry too much - different things 3  Trouble relaxing 3  Restless 2  Easily annoyed or irritable 2  Afraid - awful might happen 2  Total GAD 7 Score 18     CMP Latest Ref Rng & Units 10/12/2020 09/10/2020 08/19/2020  Glucose 70 - 99 mg/dL 96 91 113(H)  BUN 6 - 20 mg/dL 21(H) 21(H) 21(H)  Creatinine 0.44 - 1.00 mg/dL 1.11(H) 1.19(H) 1.28(H)  Sodium 135 - 145 mmol/L 137 137 137  Potassium 3.5 - 5.1 mmol/L 4.2 4.4 3.8  Chloride 98 - 111 mmol/L 105 105 106  CO2 22 - 32 mmol/L 21(L) 21(L) 22  Calcium 8.9 - 10.3 mg/dL 9.2 9.4 8.8(L)  Total Protein 6.5 - 8.1 g/dL - - -  Total Bilirubin 0.3 - 1.2 mg/dL - - -  Alkaline Phos 38 - 126 U/L - - -  AST 15 - 41 U/L - - -  ALT 0 - 44 U/L - - -   Lipid Panel     Component Value Date/Time   CHOL 216 (H) 08/18/2020 0421   TRIG 168 (H) 08/18/2020  0421   HDL 46 08/18/2020 0421   CHOLHDL 4.7 08/18/2020 0421   VLDL 34 08/18/2020 0421   LDLCALC 136 (H) 08/18/2020 0421    CBC    Component Value Date/Time   WBC 6.5 10/12/2020 0232   RBC 4.26 10/12/2020 0232   HGB 12.3 10/12/2020 0232   HCT 37.3 10/12/2020 0232   PLT 185 10/12/2020 0232   MCV 87.6 10/12/2020 0232   MCH 28.9 10/12/2020 0232   MCHC 33.0 10/12/2020 0232   RDW 14.3 10/12/2020 0232   LYMPHSABS 1.0 09/10/2020 1756   MONOABS 0.5 09/10/2020 1756   EOSABS 0.1 09/10/2020 1756   BASOSABS 0.0 09/10/2020 1756    ASSESSMENT AND PLAN:  1. Encounter to establish care  2. Essential hypertension Not at goal and quite elevated today despite having taken her medications.  Patient reports blood pressure tends to be higher when she is on her menstrual cycle. -DASH diet encouraged. -Add amlodipine. -Continue to monitor blood pressure with goal being 130/80 or lower. - Comprehensive metabolic panel - spironolactone (ALDACTONE) 25 MG tablet; Take 1 tablet (25 mg total) by mouth every morning.  Dispense: 90 tablet; Refill: 1 - losartan (COZAAR) 50 MG tablet; Take 1 tablet (50 mg total) by mouth every evening.  Dispense: 90 tablet; Refill: 1 - carvedilol (COREG) 25 MG tablet; Take 1 tablet (25 mg total) by mouth 2 (two) times daily with a meal.  Dispense: 180 tablet; Refill: 1 - hydrALAZINE (APRESOLINE) 100 MG tablet; Take 1 tablet (100 mg total) by mouth every 8 (eight) hours.  Dispense: 270 tablet; Refill: 1 - amLODipine (NORVASC) 5 MG tablet; Take 1 tablet (5 mg  total) by mouth daily.  Dispense: 30 tablet; Refill: 5  3. Chronic combined systolic and diastolic congestive heart failure (HCC) Compensated at this time but runs the risk of decompensation if blood pressure remains as elevated as it is. Stressed the importance of good blood pressure control.  Continue spironolactone, Cozaar, carvedilol and hydralazine. Spoke with her about getting her back in with the cardiologist.   She wanted to get established with me first.  We will readdress on subsequent visit with me in 6 weeks - spironolactone (ALDACTONE) 25 MG tablet; Take 1 tablet (25 mg total) by mouth every morning.  Dispense: 90 tablet; Refill: 1 - losartan (COZAAR) 50 MG tablet; Take 1 tablet (50 mg total) by mouth every evening.  Dispense: 90 tablet; Refill: 1 - carvedilol (COREG) 25 MG tablet; Take 1 tablet (25 mg total) by mouth 2 (two) times daily with a meal.  Dispense: 180 tablet; Refill: 1 - hydrALAZINE (APRESOLINE) 100 MG tablet; Take 1 tablet (100 mg total) by mouth every 8 (eight) hours.  Dispense: 270 tablet; Refill: 1  4. Morbid obesity ([place]) Dietary counseling given.  Advised to continue to avoid sugary drinks and limit portion sizes of white carbohydrates.  Commended her on avoiding red meat.  Encouraged her to incorporate fresh fruits and vegetables into the diet.  Discussed our medical weight management program.  She is interested in pursuing this.  If this is not covered by her insurance, then we will refer to nutritionist.  Referral submitted.  -Encouraged her to to continue daily exercise with goal of getting in about 150 minutes total per week of moderate intensity exercise.  Given her weight gain, we will check an A1c to screen for diabetes. - Amb Ref to Medical Weight Management - Hemoglobin A1c  5. Insomnia due to other mental disorder Likely related to depression and anxiety.   Good sleep hygiene discussed and encouraged. Patient advised not to drink any caffeinated beverages or excessive alcohol use within several hours of bedtime.  Advised to get in bed around about the same time every night.  Once in bed, turn off all lights and sounds.  If unable to fall asleep within 30 to 45 minutes of getting in bed, patient should get up and try to do something until she feels sleepy again.  At that time try getting back in bed.  6. Major depressive disorder, single episode, moderate  ([place]) Discussed management of depression and anxiety with counseling and/or medication.  She is agreeable to being placed on medication.  She reports being on Zoloft in the past and is agreeable to being placed back on that medication.  We will start with a very low dose then titrate.  If she develops any suicidal thoughts while on the medication, she should call us.  She is agreeable to referral to behavioral health.  Referral submitted - sertraline (ZOLOFT) 50 MG tablet; 1/2 tab PO daily x4 wks then 1 tab daily  Dispense: 30 tablet; Refill: 1 - Ambulatory referral to Psychiatry  7. GAD (generalized anxiety disorder) See #6 above. - sertraline (ZOLOFT) 50 MG tablet; 1/2 tab PO daily x4 wks then 1 tab daily  Dispense: 30 tablet; Refill: 1 - Ambulatory referral to Psychiatry   Patient was given the opportunity to ask questions.  Patient verbalized understanding of the plan and was able to repeat key elements of the plan.   Orders Placed This Encounter  Procedures   Comprehensive metabolic panel   Hemoglobin A1c   Amb  Ref to Medical Weight Management   Ambulatory referral to Psychiatry     Requested Prescriptions   Signed Prescriptions Disp Refills   spironolactone (ALDACTONE) 25 MG tablet 90 tablet 1    Sig: Take 1 tablet (25 mg total) by mouth every morning.   losartan (COZAAR) 50 MG tablet 90 tablet 1    Sig: Take 1 tablet (50 mg total) by mouth every evening.   carvedilol (COREG) 25 MG tablet 180 tablet 1    Sig: Take 1 tablet (25 mg total) by mouth 2 (two) times daily with a meal.   hydrALAZINE (APRESOLINE) 100 MG tablet 270 tablet 1    Sig: Take 1 tablet (100 mg total) by mouth every 8 (eight) hours.   amLODipine (NORVASC) 5 MG tablet 30 tablet 5    Sig: Take 1 tablet (5 mg total) by mouth daily.   sertraline (ZOLOFT) 50 MG tablet 30 tablet 1    Sig: 1/2 tab PO daily x4 wks then 1 tab daily    Return in about 7 weeks (around 05/25/2021) for pap.  Karle Plumber, MD,  FACP

## 2021-04-07 LAB — COMPREHENSIVE METABOLIC PANEL
ALT: 13 IU/L (ref 0–32)
AST: 16 IU/L (ref 0–40)
Albumin/Globulin Ratio: 1.6 (ref 1.2–2.2)
Albumin: 4.3 g/dL (ref 3.8–4.8)
Alkaline Phosphatase: 58 IU/L (ref 44–121)
BUN/Creatinine Ratio: 10 (ref 9–23)
BUN: 11 mg/dL (ref 6–24)
Bilirubin Total: 0.3 mg/dL (ref 0.0–1.2)
CO2: 23 mmol/L (ref 20–29)
Calcium: 9.1 mg/dL (ref 8.7–10.2)
Chloride: 103 mmol/L (ref 96–106)
Creatinine, Ser: 1.1 mg/dL — ABNORMAL HIGH (ref 0.57–1.00)
Globulin, Total: 2.7 g/dL (ref 1.5–4.5)
Glucose: 96 mg/dL (ref 65–99)
Potassium: 4.7 mmol/L (ref 3.5–5.2)
Sodium: 139 mmol/L (ref 134–144)
Total Protein: 7 g/dL (ref 6.0–8.5)
eGFR: 64 mL/min/{1.73_m2} (ref >59)

## 2021-04-07 LAB — HEMOGLOBIN A1C
Est. average glucose Bld gHb Est-mCnc: 111 mg/dL
Hgb A1c MFr Bld: 5.5 % (ref 4.8–5.6)

## 2021-04-19 ENCOUNTER — Telehealth: Payer: Self-pay | Admitting: Cardiology

## 2021-04-19 ENCOUNTER — Telehealth: Payer: Self-pay

## 2021-04-19 NOTE — Telephone Encounter (Signed)
Contacted pt to go over lab results pt is aware and doesn't have any questions or concerns 

## 2021-04-19 NOTE — Telephone Encounter (Signed)
Patient called and advised that she wanted to cancel all her appointments due to insurance and her insurance advised her they would not cover visits at our office . She would be going to a different office .

## 2021-04-26 ENCOUNTER — Telehealth: Payer: Self-pay | Admitting: Cardiology

## 2021-04-26 NOTE — Telephone Encounter (Signed)
-----   Message from [place], [place] sent at 04/20/2021  6:19 PM EDT ----- Your message: I believe she said her insurance says the wouldn't cover her visits here and was going to have to go somewhere else in network . she isnt scheduled as of right now .      Please make quick note in the chart to convey this, thanks.  ST

## 2021-04-26 NOTE — Telephone Encounter (Signed)
Patient called and advised that she would not be returing her insurance says the wouldn't cover her visits here and was going to have to go somewhere else in network . she isnt scheduled as of right now .

## 2021-05-25 ENCOUNTER — Ambulatory Visit: Payer: 59 | Admitting: Internal Medicine

## 2021-12-27 ENCOUNTER — Inpatient Hospital Stay (HOSPITAL_COMMUNITY): Payer: Self-pay

## 2021-12-27 ENCOUNTER — Emergency Department (HOSPITAL_COMMUNITY): Payer: Self-pay

## 2021-12-27 ENCOUNTER — Other Ambulatory Visit: Payer: Self-pay

## 2021-12-27 ENCOUNTER — Inpatient Hospital Stay (HOSPITAL_COMMUNITY)
Admission: EM | Admit: 2021-12-27 | Discharge: 2021-12-30 | DRG: 291 | Disposition: A | Discharge status: Home / Self Care | Payer: Self-pay | Attending: Internal Medicine | Admitting: Internal Medicine

## 2021-12-27 ENCOUNTER — Encounter (HOSPITAL_COMMUNITY): Payer: Self-pay | Admitting: *Deleted

## 2021-12-27 DIAGNOSIS — E7800 Pure hypercholesterolemia, unspecified: Secondary | ICD-10-CM

## 2021-12-27 DIAGNOSIS — R778 Other specified abnormalities of plasma proteins: Secondary | ICD-10-CM | POA: Diagnosis present

## 2021-12-27 DIAGNOSIS — E785 Hyperlipidemia, unspecified: Secondary | ICD-10-CM | POA: Diagnosis present

## 2021-12-27 DIAGNOSIS — Z8249 Family history of ischemic heart disease and other diseases of the circulatory system: Secondary | ICD-10-CM

## 2021-12-27 DIAGNOSIS — Z6841 Body Mass Index (BMI) 40.0 and over, adult: Secondary | ICD-10-CM

## 2021-12-27 DIAGNOSIS — I130 Hypertensive heart and chronic kidney disease with heart failure and stage 1 through stage 4 chronic kidney disease, or unspecified chronic kidney disease: Principal | ICD-10-CM | POA: Diagnosis present

## 2021-12-27 DIAGNOSIS — R7989 Other specified abnormal findings of blood chemistry: Secondary | ICD-10-CM | POA: Diagnosis present

## 2021-12-27 DIAGNOSIS — I3139 Other pericardial effusion (noninflammatory): Secondary | ICD-10-CM | POA: Diagnosis present

## 2021-12-27 DIAGNOSIS — G43909 Migraine, unspecified, not intractable, without status migrainosus: Secondary | ICD-10-CM | POA: Diagnosis present

## 2021-12-27 DIAGNOSIS — J45909 Unspecified asthma, uncomplicated: Secondary | ICD-10-CM | POA: Diagnosis present

## 2021-12-27 DIAGNOSIS — F32A Depression, unspecified: Secondary | ICD-10-CM | POA: Diagnosis present

## 2021-12-27 DIAGNOSIS — I161 Hypertensive emergency: Secondary | ICD-10-CM | POA: Diagnosis present

## 2021-12-27 DIAGNOSIS — R0603 Acute respiratory distress: Secondary | ICD-10-CM | POA: Diagnosis present

## 2021-12-27 DIAGNOSIS — I10 Essential (primary) hypertension: Secondary | ICD-10-CM

## 2021-12-27 DIAGNOSIS — Z833 Family history of diabetes mellitus: Secondary | ICD-10-CM

## 2021-12-27 DIAGNOSIS — F419 Anxiety disorder, unspecified: Secondary | ICD-10-CM | POA: Diagnosis present

## 2021-12-27 DIAGNOSIS — N1832 Chronic kidney disease, stage 3b: Secondary | ICD-10-CM

## 2021-12-27 DIAGNOSIS — I5042 Chronic combined systolic (congestive) and diastolic (congestive) heart failure: Secondary | ICD-10-CM

## 2021-12-27 DIAGNOSIS — Z79899 Other long term (current) drug therapy: Secondary | ICD-10-CM

## 2021-12-27 DIAGNOSIS — Z20822 Contact with and (suspected) exposure to covid-19: Secondary | ICD-10-CM | POA: Diagnosis present

## 2021-12-27 DIAGNOSIS — Z809 Family history of malignant neoplasm, unspecified: Secondary | ICD-10-CM

## 2021-12-27 DIAGNOSIS — G4700 Insomnia, unspecified: Secondary | ICD-10-CM | POA: Diagnosis present

## 2021-12-27 DIAGNOSIS — I5043 Acute on chronic combined systolic (congestive) and diastolic (congestive) heart failure: Secondary | ICD-10-CM | POA: Diagnosis present

## 2021-12-27 LAB — I-STAT BETA HCG BLOOD, ED (MC, WL, AP ONLY): I-stat hCG, quantitative: <5 m[IU]/mL (ref <5)

## 2021-12-27 LAB — CBC WITH DIFFERENTIAL/PLATELET
Abs Immature Granulocytes: 0.05 10*3/uL (ref 0.00–0.07)
Basophils Absolute: 0.1 10*3/uL (ref 0.0–0.1)
Basophils Relative: 1 %
Eosinophils Absolute: 0.3 10*3/uL (ref 0.0–0.5)
Eosinophils Relative: 3 %
HCT: 39.6 % (ref 36.0–46.0)
Hemoglobin: 12.7 g/dL (ref 12.0–15.0)
Immature Granulocytes: 1 %
Lymphocytes Relative: 15 %
Lymphs Abs: 1.5 10*3/uL (ref 0.7–4.0)
MCH: 26.7 pg (ref 26.0–34.0)
MCHC: 32.1 g/dL (ref 30.0–36.0)
MCV: 83.2 fL (ref 80.0–100.0)
Monocytes Absolute: 0.7 10*3/uL (ref 0.1–1.0)
Monocytes Relative: 7 %
Neutro Abs: 7.3 10*3/uL (ref 1.7–7.7)
Neutrophils Relative %: 73 %
Platelets: 279 10*3/uL (ref 150–400)
RBC: 4.76 MIL/uL (ref 3.87–5.11)
RDW: 16.4 % — ABNORMAL HIGH (ref 11.5–15.5)
WBC: 9.9 10*3/uL (ref 4.0–10.5)
nRBC: 0 % (ref 0.0–0.2)

## 2021-12-27 LAB — RESPIRATORY PANEL BY PCR

## 2021-12-27 LAB — URINALYSIS, ROUTINE W REFLEX MICROSCOPIC
Bilirubin Urine: NEGATIVE
Glucose, UA: NEGATIVE mg/dL
Hgb urine dipstick: NEGATIVE
Ketones, ur: NEGATIVE mg/dL
Leukocytes,Ua: NEGATIVE
Nitrite: NEGATIVE
Protein, ur: 100 mg/dL — AB
Specific Gravity, Urine: 1.014 (ref 1.005–1.030)
pH: 8 (ref 5.0–8.0)

## 2021-12-27 LAB — COMPREHENSIVE METABOLIC PANEL
ALT: 12 U/L (ref 0–44)
AST: 15 U/L (ref 15–41)
Albumin: 4.1 g/dL (ref 3.5–5.0)
Alkaline Phosphatase: 57 U/L (ref 38–126)
Anion gap: 8 (ref 5–15)
BUN: 17 mg/dL (ref 6–20)
CO2: 23 mmol/L (ref 22–32)
Calcium: 8.7 mg/dL — ABNORMAL LOW (ref 8.9–10.3)
Chloride: 107 mmol/L (ref 98–111)
Creatinine, Ser: 1.16 mg/dL — ABNORMAL HIGH (ref 0.44–1.00)
GFR, Estimated: 59 mL/min — ABNORMAL LOW (ref >60)
Glucose, Bld: 146 mg/dL — ABNORMAL HIGH (ref 70–99)
Potassium: 3.7 mmol/L (ref 3.5–5.1)
Sodium: 138 mmol/L (ref 135–145)
Total Bilirubin: 0.7 mg/dL (ref 0.3–1.2)
Total Protein: 7.2 g/dL (ref 6.5–8.1)

## 2021-12-27 LAB — MRSA NEXT GEN BY PCR, NASAL: MRSA by PCR Next Gen: NOT DETECTED

## 2021-12-27 LAB — LIPASE, BLOOD: Lipase: 47 U/L (ref 11–51)

## 2021-12-27 LAB — BRAIN NATRIURETIC PEPTIDE: B Natriuretic Peptide: 90.3 pg/mL (ref 0.0–100.0)

## 2021-12-27 LAB — RESP PANEL BY RT-PCR (FLU A&B, COVID) ARPGX2
Influenza A by PCR: NEGATIVE
Influenza B by PCR: NEGATIVE
SARS Coronavirus 2 by RT PCR: NEGATIVE

## 2021-12-27 LAB — ETHANOL: Alcohol, Ethyl (B): <10 mg/dL (ref <10)

## 2021-12-27 LAB — TROPONIN I (HIGH SENSITIVITY)
Troponin I (High Sensitivity): 51 ng/L — ABNORMAL HIGH (ref <18)
Troponin I (High Sensitivity): 50 ng/L — ABNORMAL HIGH (ref <18)

## 2021-12-27 LAB — D-DIMER, QUANTITATIVE: D-Dimer, Quant: 0.78 ug/mL-FEU — ABNORMAL HIGH (ref 0.00–0.50)

## 2021-12-27 LAB — LACTIC ACID, PLASMA: Lactic Acid, Venous: 1.6 mmol/L (ref 0.5–1.9)

## 2021-12-27 MED ORDER — ORAL CARE MOUTH RINSE
15.0000 mL | Freq: Two times a day (BID) | OROMUCOSAL | Status: DC
  Administered 2021-12-27 – 2021-12-30 (×5): 15 mL via OROMUCOSAL

## 2021-12-27 MED ORDER — SPIRONOLACTONE 25 MG PO TABS
25.0000 mg | ORAL_TABLET | Freq: Every morning | ORAL | Status: DC
Start: 2021-12-27 — End: 2021-12-29
  Administered 2021-12-27 – 2021-12-28 (×2): 25 mg via ORAL
  Filled 2021-12-27 (×2): qty 1

## 2021-12-27 MED ORDER — LABETALOL HCL 5 MG/ML IV SOLN
20.0000 mg | INTRAVENOUS | Status: DC | PRN
  Administered 2021-12-29 – 2021-12-30 (×2): 20 mg via INTRAVENOUS
  Filled 2021-12-27 (×3): qty 4

## 2021-12-27 MED ORDER — ONDANSETRON HCL 4 MG/2ML IJ SOLN: 4.0000 mg | Freq: Four times a day (QID) | INTRAMUSCULAR | Status: DC | PRN

## 2021-12-27 MED ORDER — SODIUM CHLORIDE 0.9% FLUSH: 3.0000 mL | INTRAVENOUS | Status: DC | PRN

## 2021-12-27 MED ORDER — ACETAMINOPHEN 325 MG PO TABS
650.0000 mg | ORAL_TABLET | Freq: Four times a day (QID) | ORAL | Status: DC | PRN
  Administered 2021-12-27 – 2021-12-30 (×3): 650 mg via ORAL
  Filled 2021-12-27 (×3): qty 2

## 2021-12-27 MED ORDER — SODIUM CHLORIDE 0.9% FLUSH
3.0000 mL | Freq: Two times a day (BID) | INTRAVENOUS | Status: DC
  Administered 2021-12-27 – 2021-12-30 (×6): 3 mL via INTRAVENOUS

## 2021-12-27 MED ORDER — IOHEXOL 300 MG/ML  SOLN
100.0000 mL | Freq: Once | INTRAMUSCULAR | Status: AC | PRN
  Administered 2021-12-27: 100 mL via INTRAVENOUS

## 2021-12-27 MED ORDER — LORAZEPAM 2 MG/ML IJ SOLN
1.0000 mg | Freq: Once | INTRAMUSCULAR | Status: AC
  Administered 2021-12-27: 1 mg via INTRAVENOUS
  Filled 2021-12-27: qty 1

## 2021-12-27 MED ORDER — ALBUTEROL SULFATE (2.5 MG/3ML) 0.083% IN NEBU: 3.0000 mL | INHALATION_SOLUTION | RESPIRATORY_TRACT | Status: DC | PRN

## 2021-12-27 MED ORDER — CARVEDILOL 12.5 MG PO TABS
25.0000 mg | ORAL_TABLET | Freq: Two times a day (BID) | ORAL | Status: DC
  Administered 2021-12-27 – 2021-12-30 (×7): 25 mg via ORAL
  Filled 2021-12-27 (×7): qty 2

## 2021-12-27 MED ORDER — FENTANYL CITRATE PF 50 MCG/ML IJ SOSY
25.0000 ug | PREFILLED_SYRINGE | Freq: Once | INTRAMUSCULAR | Status: AC
  Administered 2021-12-27: 25 ug via INTRAVENOUS
  Filled 2021-12-27: qty 1

## 2021-12-27 MED ORDER — SODIUM CHLORIDE 0.9 % IV BOLUS
1000.0000 mL | Freq: Once | INTRAVENOUS | Status: AC
  Administered 2021-12-27: 1000 mL via INTRAVENOUS

## 2021-12-27 MED ORDER — ONDANSETRON HCL 4 MG PO TABS: 4.0000 mg | ORAL_TABLET | Freq: Four times a day (QID) | ORAL | Status: DC | PRN

## 2021-12-27 MED ORDER — ALBUTEROL SULFATE (2.5 MG/3ML) 0.083% IN NEBU
2.5000 mg | INHALATION_SOLUTION | RESPIRATORY_TRACT | Status: DC | PRN
  Administered 2021-12-27 – 2021-12-28 (×2): 2.5 mg via RESPIRATORY_TRACT
  Filled 2021-12-27 (×2): qty 3

## 2021-12-27 MED ORDER — ASPIRIN-ACETAMINOPHEN-CAFFEINE 250-250-65 MG PO TABS
2.0000 | ORAL_TABLET | Freq: Every day | ORAL | Status: DC | PRN
  Administered 2021-12-28 (×2): 2 via ORAL
  Filled 2021-12-27 (×5): qty 2

## 2021-12-27 MED ORDER — DIPHENHYDRAMINE HCL 25 MG PO CAPS
50.0000 mg | ORAL_CAPSULE | Freq: Every day | ORAL | Status: DC
  Administered 2021-12-27: 50 mg via ORAL
  Filled 2021-12-27 (×2): qty 2

## 2021-12-27 MED ORDER — HYDRALAZINE HCL 20 MG/ML IJ SOLN: 10.0000 mg | Freq: Four times a day (QID) | INTRAMUSCULAR | Status: DC | PRN

## 2021-12-27 MED ORDER — ATORVASTATIN CALCIUM 40 MG PO TABS
40.0000 mg | ORAL_TABLET | Freq: Every day | ORAL | Status: DC
  Administered 2021-12-27 – 2021-12-29 (×3): 40 mg via ORAL
  Filled 2021-12-27 (×3): qty 1

## 2021-12-27 MED ORDER — GUAIFENESIN-DM 100-10 MG/5ML PO SYRP
5.0000 mL | ORAL_SOLUTION | ORAL | Status: DC | PRN
Start: 2021-12-27 — End: 2021-12-30
  Administered 2021-12-27 – 2021-12-28 (×2): 5 mL via ORAL
  Filled 2021-12-27 (×3): qty 10

## 2021-12-27 MED ORDER — NITROGLYCERIN IN D5W 200-5 MCG/ML-% IV SOLN
0.0000 ug/min | INTRAVENOUS | Status: DC
  Administered 2021-12-27: 5 ug/min via INTRAVENOUS
  Filled 2021-12-27: qty 250

## 2021-12-27 MED ORDER — SODIUM CHLORIDE (PF) 0.9 % IJ SOLN
INTRAMUSCULAR | Status: AC
  Filled 2021-12-27: qty 50

## 2021-12-27 MED ORDER — HYDRALAZINE HCL 50 MG PO TABS
100.0000 mg | ORAL_TABLET | Freq: Three times a day (TID) | ORAL | Status: DC
Start: 2021-12-27 — End: 2021-12-28
  Administered 2021-12-27 – 2021-12-28 (×4): 100 mg via ORAL
  Filled 2021-12-27 (×5): qty 2

## 2021-12-27 MED ORDER — AMLODIPINE BESYLATE 5 MG PO TABS
5.0000 mg | ORAL_TABLET | Freq: Every day | ORAL | Status: DC
  Administered 2021-12-27 – 2021-12-29 (×3): 5 mg via ORAL
  Filled 2021-12-27 (×3): qty 1

## 2021-12-27 MED ORDER — SODIUM CHLORIDE 0.9 % IV SOLN: 250.0000 mL | INTRAVENOUS | Status: DC | PRN

## 2021-12-27 MED ORDER — ENOXAPARIN SODIUM 60 MG/0.6ML IJ SOSY
60.0000 mg | PREFILLED_SYRINGE | INTRAMUSCULAR | Status: DC
  Administered 2021-12-27 – 2021-12-29 (×3): 60 mg via SUBCUTANEOUS
  Filled 2021-12-27 (×3): qty 0.6

## 2021-12-27 MED ORDER — ACETAMINOPHEN 650 MG RE SUPP: 650.0000 mg | Freq: Four times a day (QID) | RECTAL | Status: DC | PRN

## 2021-12-27 MED ORDER — CHLORHEXIDINE GLUCONATE CLOTH 2 % EX PADS
6.0000 | MEDICATED_PAD | Freq: Every day | CUTANEOUS | Status: DC
  Administered 2021-12-27 – 2021-12-29 (×3): 6 via TOPICAL

## 2021-12-27 NOTE — ED Triage Notes (Signed)
Abd pain and short with tachypnea since yesterday ?

## 2021-12-27 NOTE — Progress Notes (Signed)
Spoke with Nuclear Med Tech, Devin, who endorsed test could not be preformed tonight after 2030. Rebecca Donovan, provider on-call, notified. Pt currently resting with unlabored breathing on RA. SpO2 94%. Tech endorsed she would put scan in to be performed in AM.  ?

## 2021-12-27 NOTE — Progress Notes (Signed)
STAT Nuclear medicine Perf and vent ordered. Nuclear medicine tech was contacted and states that she needs a perf order only and cant do ventilation. MD [facility] paged. Awaiting call back.  ?Renae Gloss, RN ?12/27/2021  ? ?

## 2021-12-27 NOTE — H&P (Signed)
?History and Physical  ? ? ?Rebecca Donovan  ?IZT:[id number]  ?DOB: [birth year]  ?DOA: 12/27/2021 ?PCP: Ladell Pier, MD  ? ?Patient coming from: Home ? ?Chief Complaint: Shortness of breath ? ?HPI: Rebecca Donovan is a 46 y.o. female with medical history of hypertension who presents to the hospital for abdominal pain nausea vomiting and shortness of breath.  Symptoms started yesterday.  She states that she was folding laundry when she noticed a heavy feeling in her upper abdomen associated with shortness of breath.  She felt this progressing today until shortness of breath became severe.  Although in the ED it was noted that she had a high blood pressure, she states that yesterday after her symptoms started, her blood pressure was not elevated.  Due to her symptoms, she was unable to take her medications this morning.  In addition to shortness of breath she is noticing some mild dry cough which started this morning.  She has allergies and has an occasional runny nose but has not had any further symptoms. ?When questioned about review of systems, she admits to noticing new onset left lower extremity swelling.  There is no pain in the left leg.  No prior history in this leg.  No prior history of blood clots.  She does not smoke and she is not on any oral contraceptives.  She has not had any recent long travel. ? ?ED Course: Pressure 252/142, heart rate 124, respiratory rate 26, temperature 99%, pulse ox 100% on room air ?CT scan of the abdomen and pelvis> 3.2 cm nodular opacity in the lateral right lung base is probably infectious/inflammatory small bilateral pleural effusions-no abdominal pathology ? ?Review of Systems:  ?All other systems reviewed and apart from HPI, are negative. ? ?Past Medical History:  ?Diagnosis Date  ? Cancer [facility])   ? Hyperlipidemia   ? Hypertension   ? ? ?Past Surgical History:  ?Procedure Laterality Date  ? CERVIX SURGERY    ? ? ?Social History:  ? reports that she has never smoked. She has  never used smokeless tobacco. She reports that she does not drink alcohol and does not use drugs. ? ?No Known Allergies ? ?Family History  ?Problem Relation Age of Onset  ? Hypertension Mother   ? Diabetes Maternal Grandfather   ? Cancer Paternal Grandfather   ? ? ? ?Prior to Admission medications   ?Medication Sig Start Date End Date Taking? Authorizing Provider  ?acetaminophen (TYLENOL) 500 MG tablet Take 1,000 mg by mouth daily as needed (pain).   Yes [provider]  ?albuterol (VENTOLIN HFA) 108 (90 Base) MCG/ACT inhaler Inhale 2 puffs into the lungs See admin instructions. Inhale 2 puffs six times a day as needed for shortness of breath   Yes [provider]  ?amLODipine (NORVASC) 5 MG tablet Take 1 tablet (5 mg total) by mouth daily. 04/06/21  Yes Ladell Pier, MD  ?Ascorbic Acid (VITAMIN C PO) Take 1,500 mg by mouth daily as needed (when feeling sick).   Yes [provider]  ?aspirin-acetaminophen-caffeine (EXCEDRIN MIGRAINE) [phone] MG tablet Take 2 tablets by mouth daily as needed (severe headache).   Yes [provider]  ?carvedilol (COREG) 25 MG tablet Take 1 tablet (25 mg total) by mouth 2 (two) times daily with a meal. 04/06/21  Yes Ladell Pier, MD  ?Coenzyme Q10 (CO Q 10 PO) Take 2 tablets by mouth daily.   Yes [provider]  ?Homeopathic Products (ARNICARE ARNICA) CREA Apply 1 application topically  daily as needed (bruises).   Yes [provider]  ?hydrALAZINE (APRESOLINE) 100 MG tablet Take 1 tablet (100 mg total) by mouth every 8 (eight) hours. 04/06/21  Yes Ladell Pier, MD  ?losartan (COZAAR) 50 MG tablet Take 1 tablet (50 mg total) by mouth every evening. 04/06/21  Yes Ladell Pier, MD  ?OVER THE COUNTER MEDICATION Take 1 tablet by mouth daily. Multivitamin   Yes [provider]  ?spironolactone (ALDACTONE) 25 MG tablet Take 1 tablet (25 mg total) by mouth every morning. 04/06/21  Yes Ladell Pier, MD   ?atorvastatin (LIPITOR) 40 MG tablet Take 1 tablet (40 mg total) by mouth at bedtime. ?Patient not taking: Reported on 12/27/2021 09/19/20 12/18/20  Rex Kras, DO  ?sertraline (ZOLOFT) 50 MG tablet 1/2 tab PO daily x4 wks then 1 tab daily ?Patient not taking: Reported on 12/27/2021 04/06/21   Ladell Pier, MD  ? ? ?Physical Exam: ?Wt Readings from Last 3 Encounters:  ?12/27/21 127 kg  ?04/06/21 124.7 kg  ?10/12/20 106.1 kg  ? ?Vitals:  ? 12/27/21 1415 12/27/21 1430 12/27/21 1448 12/27/21 1600  ?BP: 129/85 135/87 (!) 150/91 (!) 165/101  ?Pulse: 88 91 88 87  ?Resp: '18  18 18  '$ ?Temp:      ?SpO2: 93% 96% 94% 95%  ?Weight:      ?Height:      ? ? ? ? ?Constitutional:  Calm & comfortable ?Eyes: PERRLA, lids and conjunctivae normal ?ENT:  ?Mucous membranes are moist.  ?Pharynx clear of exudate   ?Normal dentition.  ?Respiratory:  ?Clear to auscultation bilaterally  ?With sitting up, she feels short of breath and her respiratory rate increases ?Cardiovascular:  ?S1 & S2 heard, regular rate and rhythm ?No Murmurs ?Abdomen:  ?Non distended ?No tenderness, ?No masses ?Bowel sounds normal ?Extremities:  ?No clubbing / cyanosis ?Edema of the left lower extremity ?Skin:  ?No rashes, lesions or ulcers ?Neurologic:  ?AAO x 3 ?CN 2-12 grossly intact ?Sensation intact ?Strength 5/5 in all 4 extremities ?Psychiatric:  ?Normal Mood and affect ? ? ? ?Labs on Admission: I have personally reviewed following labs and imaging studies ? ?CBC: ?Recent Labs  ?Lab 12/27/21 ?0903  ?WBC 9.9  ?NEUTROABS 7.3  ?HGB 12.7  ?HCT 39.6  ?MCV 83.2  ?PLT 279  ? ?Basic Metabolic Panel: ?Recent Labs  ?Lab 12/27/21 ?0903  ?NA 138  ?K 3.7  ?CL 107  ?CO2 23  ?GLUCOSE 146*  ?BUN 17  ?CREATININE 1.16*  ?CALCIUM 8.7*  ? ?GFR: ?Estimated Creatinine Clearance: 78.2 mL/min (A) (by C-G formula based on SCr of 1.16 mg/dL (H)). ?Liver Function Tests: ?Recent Labs  ?Lab 12/27/21 ?0903  ?AST 15  ?ALT 12  ?ALKPHOS 57  ?BILITOT 0.7  ?PROT 7.2  ?ALBUMIN 4.1  ? ?Recent  Labs  ?Lab 12/27/21 ?0903  ?LIPASE 47  ? ?No results for input(s): AMMONIA in the last 168 hours. ?Coagulation Profile: ?No results for input(s): INR, PROTIME in the last 168 hours. ?Cardiac Enzymes: ?No results for input(s): CKTOTAL, CKMB, CKMBINDEX, TROPONINI in the last 168 hours. ?BNP (last 3 results) ?No results for input(s): PROBNP in the last 8760 hours. ?HbA1C: ?No results for input(s): HGBA1C in the last 72 hours. ?CBG: ?No results for input(s): GLUCAP in the last 168 hours. ?Lipid Profile: ?No results for input(s): CHOL, HDL, LDLCALC, TRIG, CHOLHDL, LDLDIRECT in the last 72 hours. ?Thyroid Function Tests: ?No results for input(s): TSH, T4TOTAL, FREET4, T3FREE, THYROIDAB in the last 72 hours. ?Anemia Panel: ?  No results for input(s): VITAMINB12, FOLATE, FERRITIN, TIBC, IRON, RETICCTPCT in the last 72 hours. ?Urine analysis: ?   ?Component Value Date/Time  ? COLORURINE YELLOW 12/27/2021 0903  ? APPEARANCEUR CLEAR 12/27/2021 0903  ? LABSPEC 1.014 12/27/2021 0903  ? PHURINE 8.0 12/27/2021 0903  ? GLUCOSEU NEGATIVE 12/27/2021 0903  ? HGBUR NEGATIVE 12/27/2021 0903  ? BILIRUBINUR NEGATIVE 12/27/2021 0903  ? KETONESUR NEGATIVE 12/27/2021 0903  ? PROTEINUR 100 (A) 12/27/2021 0903  ? NITRITE NEGATIVE 12/27/2021 0903  ? LEUKOCYTESUR NEGATIVE 12/27/2021 0903  ? ?Sepsis Labs: ?'@LABRCNTIP'$ (procalcitonin:4,lacticidven:4) ?)No results found for this or any previous visit (from the past 240 hour(s)).  ? ?Radiological Exams on Admission: ?DG Chest 2 View ? ?Result Date: 12/27/2021 ?CLINICAL DATA:  Right lower lesion, possible pneumonia. EXAM: CHEST - 2 VIEW COMPARISON:  Radiographs 10/12/2020. Abdominopelvic CT 12/27/2021 and chest CT 08/16/2020. FINDINGS: Mild enlargement of the cardiac silhouette, corresponding with a small pericardial effusion on CT. There is a small right pleural effusion with vascular congestion and possible mild pulmonary edema. The subpleural airspace disease at the right lateral lung base seen on  earlier abdominal CT is not well visualized. There is no pneumothorax. The bones appear unremarkable. Telemetry leads overlie the chest. IMPRESSION: Enlarged cardiac silhouette, mild pulmonary edema and small ri

## 2021-12-27 NOTE — ED Provider Notes (Signed)
?[facility] DEPT ?Provider Note ? ? ?CSN: [id number] ?Arrival date & time: 12/27/21  6073 ? ?  ? ?History ? ?Chief Complaint  ?Patient presents with  ? Abdominal Pain  ? Shortness of Breath  ? Anxiety  ? ? ?Rebecca Donovan is a 46 y.o. female. ? ?HPI ?Patient presents with abdominal pain, nausea, vomiting.  There is associated shortness of breath, but this seems to be secondary to the abdominal pain, which began yesterday, since that time has been progressive, with increased nausea, anorexia, inability to take medication including antihypertensives.  She was brought expeditiously from triage due to hypertension, systolic greater than 710, tachycardia, tachypnea and shortness of breath. ?  ? ?Home Medications ?Prior to Admission medications   ?Medication Sig Start Date End Date Taking? Authorizing Provider  ?acetaminophen (TYLENOL) 500 MG tablet Take 1,000 mg by mouth daily as needed (pain).    [provider]  ?amLODipine (NORVASC) 5 MG tablet Take 1 tablet (5 mg total) by mouth daily. 04/06/21   Ladell Pier, MD  ?aspirin-acetaminophen-caffeine (EXCEDRIN MIGRAINE) [phone] MG tablet Take 1 tablet by mouth daily as needed (severe headache).    [provider]  ?atorvastatin (LIPITOR) 40 MG tablet Take 1 tablet (40 mg total) by mouth at bedtime. 09/19/20 12/18/20  Rex Kras, DO  ?carvedilol (COREG) 25 MG tablet Take 1 tablet (25 mg total) by mouth 2 (two) times daily with a meal. 04/06/21   Ladell Pier, MD  ?Homeopathic Products ([facility]NICA) CREA Apply 1 application topically daily as needed (bruises).    [provider]  ?hydrALAZINE (APRESOLINE) 100 MG tablet Take 1 tablet (100 mg total) by mouth every 8 (eight) hours. 04/06/21   Ladell Pier, MD  ?losartan (COZAAR) 50 MG tablet Take 1 tablet (50 mg total) by mouth every evening. 04/06/21   Ladell Pier, MD  ?OVER THE COUNTER MEDICATION Place 1 patch onto the skin daily. Innovative  multi vitamin patch    [provider]  ?sertraline (ZOLOFT) 50 MG tablet 1/2 tab PO daily x4 wks then 1 tab daily 04/06/21   Ladell Pier, MD  ?spironolactone (ALDACTONE) 25 MG tablet Take 1 tablet (25 mg total) by mouth every morning. 04/06/21   Ladell Pier, MD  ?   ? ?Allergies    ?Patient has no known allergies.   ? ?Review of Systems   ?Review of Systems  ?Constitutional:   ?     Per HPI, otherwise negative  ?HENT:    ?     Per HPI, otherwise negative  ?Respiratory:    ?     Per HPI, otherwise negative  ?Cardiovascular:   ?     Per HPI, otherwise negative  ?Gastrointestinal:  Positive for abdominal pain and vomiting.  ?Endocrine:  ?     Negative aside from HPI  ?Genitourinary:   ?     Cervical cancer resection  ?Musculoskeletal:   ?     Per HPI, otherwise negative  ?Skin: Negative.   ?Neurological:  Negative for syncope.  ? ?Physical Exam ?Updated Vital Signs ?BP (!) 252/142 (BP Location: Right Arm)   Pulse (!) 124   Temp 99 ?F (37.2 ?C)   Resp (!) 26   Ht '5\' 2"'$  (1.575 m)   Wt 127 kg   SpO2 100%   BMI 51.21 kg/m?  ?Physical Exam ?Vitals and nursing note reviewed.  ?Constitutional:   ?   Appearance: She is well-developed. She is obese. She  is ill-appearing and diaphoretic.  ?HENT:  ?   Head: Normocephalic and atraumatic.  ?Eyes:  ?   Conjunctiva/sclera: Conjunctivae normal.  ?Cardiovascular:  ?   Rate and Rhythm: Normal rate and regular rhythm.  ?Pulmonary:  ?   Effort: Tachypnea present. No respiratory distress.  ?   Breath sounds: Normal breath sounds. No stridor.  ?Abdominal:  ?   General: There is no distension.  ?   Tenderness: There is generalized abdominal tenderness. There is guarding.  ?Skin: ?   General: Skin is warm.  ?Neurological:  ?   Mental Status: She is alert and oriented to person, place, and time.  ?   Cranial Nerves: No cranial nerve deficit.  ?Psychiatric:     ?   Mood and Affect: Mood is anxious.  ? ? ?ED Results / Procedures / Treatments   ?Labs ?(all labs  ordered are listed, but only abnormal results are displayed) ?Labs Reviewed  ?CBC WITH DIFFERENTIAL/PLATELET - Abnormal; Notable for the following components:  ?    Result Value  ? RDW 16.4 (*)   ? All other components within normal limits  ?COMPREHENSIVE METABOLIC PANEL  ?ETHANOL  ?LACTIC ACID, PLASMA  ?LACTIC ACID, PLASMA  ?LIPASE, BLOOD  ?URINALYSIS, ROUTINE W REFLEX MICROSCOPIC  ?I-STAT BETA HCG BLOOD, ED ([facility], [facility], [facility] ONLY)  ? ? ?EKG ?None ? ?Radiology ?No results found. ? ?Procedures ?Procedures  ? ? ?Medications Ordered in ED ?Medications  ?sodium chloride 0.9 % bolus 1,000 mL (1,000 mLs Intravenous New Bag/Given 12/27/21 0914)  ?fentaNYL (SUBLIMAZE) injection 25 mcg (25 mcg Intravenous Given 12/27/21 0910)  ?LORazepam (ATIVAN) injection 1 mg (1 mg Intravenous Given 12/27/21 0910)  ? ? ?ED Course/ Medical Decision Making/ A&P ?This patient with a Hx of anxiety, obesity, cervical cancer resection presents to the ED for concern of abdominal pain, nausea, vomiting, hypertensive crisis, this involves an extensive number of treatment options, and is a complaint that carries with it a high risk of complications and morbidity.   ? ?The differential diagnosis includes complications of hypertension, bowel obstruction, intra-abdominal infection ? ? ?Social Determinants of Health: ? ?Obesity ? ?Additional history obtained: ? ?Additional history and/or information obtained from chart review, notable for ongoing efforts to control hypertension ? ? ?After the initial evaluation, orders, including: Labs Ativan analgesics, fluids CT were initiated. ? ? ?Patient placed on Cardiac and Pulse-Oximetry Monitors. ?The patient was maintained on a cardiac monitor.  The cardiac monitored showed an rhythm of sinus tach 120 abnormal ?The patient was also maintained on pulse oximetry. The readings were typically 100% room air normal ? ? ?On repeat evaluation of the patient improved ? ? ?After initial Ativan the patient calm, but blood  pressure remained elevated.  She was provided oral doses of her medication regimen, including Norvasc, carvedilol, hydralazine, spironolactone. ? ?Update:, In spite of home meds, patient has persistent hypertension, labetalol provided ? ?3:56 PM ?Blood pressure now elevated again, 180/115, with concern for rebound hypertension, she is also having dyspnea, nitro drip ordered. ? ?Lab Tests: ? ?I personally interpreted labs.  The pertinent results include: Mild hyperglycemia otherwise unremarkable labs ? ?Imaging Studies ordered: ? ?I independently visualized and interpreted imaging which showed abdominal CT generally unremarkable aside from possible scarring in the right lower lobe of the chest.  Follow-up x-ray with pulmonary congestion ?I agree with the radiologist interpretation ? ?Consultations Obtained: ? ?I requested consultation with the internal medicine,  and discussed lab and imaging findings as well as pertinent plan -  they recommend: Admission ? ?Dispostion / Final MDM: ? ?After consideration of the diagnostic results and the patient's response to treatment, patient will require admission, stepdown.  This adult female with multiple medical issues including diabetes, hypertension presents dyspneic, tachycardic, anxious, complaining of full sensation in her abdomen and thorax.  Differential including intra-abdominal versus intrathoracic pathology including obstruction, infection, perforation considered.  Patient actually has no chest pain specifically.  Findings somewhat reassuring, though she had persistent hypertension and x-ray demonstrated pulmonary edema, possibly secondary to persistent blood pressure elevation.  In spite of multiple antihypertensives patient had rebound hypertension, and began to have symptomatic dyspnea as well.  Patient started on nitro drip, required admission for monitoring, management. ?Wells score 0 or 1 depending on when the assessment is performed, low risk PE, and the patient  had a COVID test tried in the ED today, not a candidate for additional deprivation today. ? ?Final Clinical Impression(s) / ED Diagnoses ?Final diagnoses:  ?Hypertensive emergency  ?CRITICAL CARE ?Performed by: Clelia Croft

## 2021-12-28 ENCOUNTER — Inpatient Hospital Stay (HOSPITAL_COMMUNITY): Payer: Self-pay

## 2021-12-28 ENCOUNTER — Encounter (HOSPITAL_COMMUNITY): Payer: Self-pay | Admitting: Internal Medicine

## 2021-12-28 DIAGNOSIS — R0603 Acute respiratory distress: Secondary | ICD-10-CM

## 2021-12-28 DIAGNOSIS — M7989 Other specified soft tissue disorders: Secondary | ICD-10-CM

## 2021-12-28 DIAGNOSIS — I5043 Acute on chronic combined systolic (congestive) and diastolic (congestive) heart failure: Secondary | ICD-10-CM

## 2021-12-28 LAB — BASIC METABOLIC PANEL
Anion gap: 7 (ref 5–15)
BUN: 18 mg/dL (ref 6–20)
CO2: 23 mmol/L (ref 22–32)
Calcium: 8.6 mg/dL — ABNORMAL LOW (ref 8.9–10.3)
Chloride: 108 mmol/L (ref 98–111)
Creatinine, Ser: 1.16 mg/dL — ABNORMAL HIGH (ref 0.44–1.00)
GFR, Estimated: 59 mL/min — ABNORMAL LOW (ref >60)
Glucose, Bld: 119 mg/dL — ABNORMAL HIGH (ref 70–99)
Potassium: 4.2 mmol/L (ref 3.5–5.1)
Sodium: 138 mmol/L (ref 135–145)

## 2021-12-28 LAB — ECHOCARDIOGRAM COMPLETE
AR max vel: 2.18 cm2
AV Area VTI: 2.03 cm2
AV Area mean vel: 2.49 cm2
AV Mean grad: 6 mmHg
AV Peak grad: 10.8 mmHg
Ao pk vel: 1.64 m/s
Area-P 1/2: 6.17 cm2
Height: 62 in
S' Lateral: 4.3 cm
Weight: 4480 oz

## 2021-12-28 LAB — CBC
HCT: 36.5 % (ref 36.0–46.0)
Hemoglobin: 11 g/dL — ABNORMAL LOW (ref 12.0–15.0)
MCH: 26 pg (ref 26.0–34.0)
MCHC: 30.1 g/dL (ref 30.0–36.0)
MCV: 86.3 fL (ref 80.0–100.0)
Platelets: 219 10*3/uL (ref 150–400)
RBC: 4.23 MIL/uL (ref 3.87–5.11)
RDW: 16.4 % — ABNORMAL HIGH (ref 11.5–15.5)
WBC: 7 10*3/uL (ref 4.0–10.5)
nRBC: 0 % (ref 0.0–0.2)

## 2021-12-28 LAB — HIV ANTIBODY (ROUTINE TESTING W REFLEX): HIV Screen 4th Generation wRfx: NONREACTIVE

## 2021-12-28 LAB — TSH: TSH: 1.049 u[IU]/mL (ref 0.350–4.500)

## 2021-12-28 MED ORDER — IOHEXOL 350 MG/ML SOLN
100.0000 mL | Freq: Once | INTRAVENOUS | Status: AC | PRN
  Administered 2021-12-28: 100 mL via INTRAVENOUS

## 2021-12-28 MED ORDER — SODIUM CHLORIDE (PF) 0.9 % IJ SOLN
INTRAMUSCULAR | Status: AC
  Filled 2021-12-28: qty 50

## 2021-12-28 MED ORDER — FUROSEMIDE 10 MG/ML IJ SOLN
40.0000 mg | Freq: Three times a day (TID) | INTRAMUSCULAR | Status: DC
  Administered 2021-12-28 – 2021-12-29 (×5): 40 mg via INTRAVENOUS
  Filled 2021-12-28 (×5): qty 4

## 2021-12-28 NOTE — Progress Notes (Signed)
?[facility] Progress Note ? ?Patient: Rebecca Donovan     ?JJO:[id number]  ?DOA: 12/27/2021   ?PCP: Ladell Pier, MD  ? ?  ?  ?Brief hospital course: ? Rebecca Donovan is a 46 y.o. female with medical history of hypertension who presents to the hospital for abdominal pain nausea vomiting and shortness of breath.  Symptoms started yesterday.  She states that she was folding laundry when she noticed a heavy feeling in her upper abdomen associated with shortness of breath.  She felt this progressing today until shortness of breath became severe.  Although in the ED it was noted that she had a high blood pressure, she states that yesterday after her symptoms started, her blood pressure was not elevated.  Due to her symptoms, she was unable to take her medications this morning.  In addition to shortness of breath she is noticing some mild dry cough which started this morning.  She has allergies and has an occasional runny nose but has not had any further symptoms. ?When questioned about review of systems, she admits to noticing new onset left lower extremity swelling.  There is no pain in the left leg.  No prior history in this leg.  No prior history of blood clots.  She does not smoke and she is not on any oral contraceptives.  She has not had any recent long travel. ?  ? ?Subjective:  ?Shortness of breath is slightly better today but still quite noticeable. She feels her cough is deeper today and more rattling is present. Her voice is hoarse.  ? ?Assessment and Plan: ?Principal Problem: ?  Acute respiratory distress ?-Believe that this may be due to acute on chronic combined systolic and diastolic heart  ?- I have discussed the plan with the patient and have started Lasix   ?-2D echo from 2021 revealed an EF of 45 to 50% and grade 2 diastolic dysfunction ?- 2D echo is being repeated ?- Will start heart failure teaching ? ?Active Problems: ?  Hypertensive emergency ?-Resulting in above ?- BP much better controlled  today ? ?  Morbid obesity ([place]) ?Body mass index is 51.21 kg/m?. ?-Have discussed plan for weight loss ?  ?  ?DVT prophylaxis:  Lovenox ? ?  Code Status: Full Code  ?Consultants: None ?Level of Care: Level of care: Stepdown ?Disposition Plan:  ?Status is: Inpatient ?Remains inpatient appropriate because: Treating acute heart failure ? ?Objective: ?  ?Vitals:  ? 12/28/21 1000 12/28/21 1100 12/28/21 1200 12/28/21 1210  ?BP: 137/66 136/70 130/67   ?Pulse:      ?Resp: 20 (!) 37 19   ?Temp:    98.4 ?F (36.9 ?C)  ?TempSrc:    Oral  ?SpO2:      ?Weight:      ?Height:      ? ?Filed Weights  ? 12/27/21 0856  ?Weight: 127 kg  ? ?Exam: ?General exam: Appears comfortable  ?HEENT: PERRLA, oral mucosa moist, no sclera icterus or thrush ?Respiratory system: Due to body habitus, breath sounds are poor-respiratory rate increases when she is asked to sit up in the bed ?Cardiovascular system: S1 & S2 heard, regular rate and rhythm ?Gastrointestinal system: Abdomen soft, non-tender, nondistended. Normal bowel sounds   ?Central nervous system: Alert and oriented. No focal neurological deficits. ?Extremities: No cyanosis, clubbing -edema of left lower extremity present ?Skin: No rashes or ulcers ?Psychiatry:  Mood & affect appropriate.   ? ?Imaging and lab data was personally reviewed ? ? ? CBC: ?Recent  Labs  ?Lab 12/27/21 ?0903 12/28/21 ?0240  ?WBC 9.9 7.0  ?NEUTROABS 7.3  --   ?HGB 12.7 11.0*  ?HCT 39.6 36.5  ?MCV 83.2 86.3  ?PLT 279 219  ? ?Basic Metabolic Panel: ?Recent Labs  ?Lab 12/27/21 ?0903 12/28/21 ?0240  ?NA 138 138  ?K 3.7 4.2  ?CL 107 108  ?CO2 23 23  ?GLUCOSE 146* 119*  ?BUN 17 18  ?CREATININE 1.16* 1.16*  ?CALCIUM 8.7* 8.6*  ? ?GFR: ?Estimated Creatinine Clearance: 78.2 mL/min (A) (by C-G formula based on SCr of 1.16 mg/dL (H)). ? ?Scheduled Meds: ? amLODipine  5 mg Oral Daily  ? atorvastatin  40 mg Oral QHS  ? carvedilol  25 mg Oral BID WC  ? Chlorhexidine Gluconate Cloth  6 each Topical Daily  ? diphenhydrAMINE  50 mg  Oral QHS  ? enoxaparin (LOVENOX) injection  60 mg Subcutaneous Q24H  ? furosemide  40 mg Intravenous TID  ? hydrALAZINE  100 mg Oral Q8H  ? mouth rinse  15 mL Mouth Rinse BID  ? sodium chloride (PF)      ? sodium chloride flush  3 mL Intravenous Q12H  ? spironolactone  25 mg Oral q morning  ? ?Continuous Infusions: ? sodium chloride    ? nitroGLYCERIN Stopped (12/27/21 1937)  ? ? ? LOS: 1 day  ? ?Author: ?Debbe Odea  ?12/28/2021 1:03 PM ?   ?

## 2021-12-28 NOTE — Progress Notes (Signed)
Left lower extremity venous duplex has been completed. ?Preliminary results can be found in CV Proc through chart review.  ? ?12/28/21 9:31 AM ?Rebecca Donovan RVT   ?

## 2021-12-28 NOTE — TOC Initial Note (Signed)
Transition of Care (TOC) - Initial/Assessment Note  ? ? ?Patient Details  ?Name: Rebecca Donovan ?MRN: [id number] ?Date of Birth: 06-07-1976 ? ?Transition of Care [facility]) CM/SW Contact:    ?Leeroy Cha, RN ?Phone Number: ?12/28/2021, 7:46 AM ? ?Clinical Narrative:                 ? ?Transition of Care (TOC) Screening Note ? ? ?Patient Details  ?Name: Rebecca Donovan ?Date of Birth: 10/17/1975 ? ? ?Transition of Care [facility]) CM/SW Contact:    ?Leeroy Cha, RN ?Phone Number: ?12/28/2021, 7:46 AM ? ? ? ?Transition of Care Department [facility]) has reviewed patient and no TOC needs have been identified at this time. We will continue to monitor patient advancement through interdisciplinary progression rounds. If new patient transition needs arise, please place a TOC consult. ? ? ? ?Expected Discharge Plan: Home/Self Care ?Barriers to Discharge: No Barriers Identified ? ? ?Patient Goals and CMS Choice ?Patient states their goals for this hospitalization and ongoing recovery are:: to go home and be well ?CMS Medicare.gov Compare Post Acute Care list provided to:: Patient ?  ? ?Expected Discharge Plan and Services ?Expected Discharge Plan: Home/Self Care ?  ?Discharge Planning Services: CM Consult ?  ?Living arrangements for the past 2 months: [place] ?                ?  ?  ?  ?  ?  ?  ?  ?  ?  ?  ? ?Prior Living Arrangements/Services ?Living arrangements for the past 2 months: [place] ?Lives with:: Self ?Patient language and need for interpreter reviewed:: Yes ?Do you feel safe going back to the place where you live?: Yes      ?  ?  ?  ?Criminal Activity/Legal Involvement Pertinent to Current Situation/Hospitalization: No - Comment as needed ? ?Activities of Daily Living ?Home Assistive Devices/Equipment: Eyeglasses ?ADL Screening (condition at time of admission) ?Patient's cognitive ability adequate to safely complete daily activities?: Yes ?Is the patient deaf or have difficulty hearing?: No ?Does the  patient have difficulty seeing, even when wearing glasses/contacts?: Yes (eyes get blurry at times) ?Does the patient have difficulty concentrating, remembering, or making decisions?: Yes (due to exhaustion per pt) ?Patient able to express need for assistance with ADLs?: Yes ?Does the patient have difficulty dressing or bathing?: No ?Independently performs ADLs?: Yes (appropriate for developmental age) ?Does the patient have difficulty walking or climbing stairs?: Yes (get sob) ?Weakness of Legs: Both ?Weakness of Arms/Hands: Both ? ?Permission Sought/Granted ?  ?  ?   ?   ?   ?   ? ?Emotional Assessment ?Appearance:: Appears stated age ?Attitude/Demeanor/Rapport: Engaged ?Affect (typically observed): Calm ?Orientation: : Oriented to Place, Oriented to Self, Oriented to  Time, Oriented to Situation ?Alcohol / Substance Use: Not Applicable ?Psych Involvement: No (comment) ? ?Admission diagnosis:  Acute respiratory distress [R06.03] ?Hypertensive emergency [I16.1] ?Patient Active Problem List  ? Diagnosis Date Noted  ? Acute respiratory distress 12/27/2021  ? Essential hypertension 04/06/2021  ? Chronic combined systolic and diastolic congestive heart failure ([place]) 04/06/2021  ? 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 Major depressive disorder, single episode, moderate ([place]) 04/06/2021  ? GAD (generalized anxiety disorder) 04/06/2021  ? Cardiomyopathy ([place])   ? Hypertensive emergency   ? SOB (shortness of breath)   ? Elevated troponin level not due to acute coronary syndrome   ? Community acquired pneumonia   ? Pericardial effusion   ?  Morbid obesity ([place])   ? Elevated brain natriuretic peptide (BNP) level   ? Hypertensive urgency 08/16/2020  ? ?PCP:  Ladell Pier, MD ?Pharmacy:   ?[id number] [place] ?[address] [id number]?Phone: [phone]: [phone]? ? ? ? ?Social Determinants of Health (SDOH) Interventions ?  ? ?Readmission Risk  Interventions ?   ? View : No data to display.  ?  ?  ?  ? ? ? ?

## 2021-12-29 DIAGNOSIS — I3139 Other pericardial effusion (noninflammatory): Secondary | ICD-10-CM

## 2021-12-29 DIAGNOSIS — I5023 Acute on chronic systolic (congestive) heart failure: Secondary | ICD-10-CM

## 2021-12-29 LAB — CBC
HCT: 34.1 % — ABNORMAL LOW (ref 36.0–46.0)
Hemoglobin: 10.9 g/dL — ABNORMAL LOW (ref 12.0–15.0)
MCH: 26.9 pg (ref 26.0–34.0)
MCHC: 32 g/dL (ref 30.0–36.0)
MCV: 84.2 fL (ref 80.0–100.0)
Platelets: 208 10*3/uL (ref 150–400)
RBC: 4.05 MIL/uL (ref 3.87–5.11)
RDW: 16.5 % — ABNORMAL HIGH (ref 11.5–15.5)
WBC: 6.4 10*3/uL (ref 4.0–10.5)
nRBC: 0 % (ref 0.0–0.2)

## 2021-12-29 LAB — BASIC METABOLIC PANEL
Anion gap: 7 (ref 5–15)
BUN: 24 mg/dL — ABNORMAL HIGH (ref 6–20)
CO2: 25 mmol/L (ref 22–32)
Calcium: 8.5 mg/dL — ABNORMAL LOW (ref 8.9–10.3)
Chloride: 105 mmol/L (ref 98–111)
Creatinine, Ser: 1.36 mg/dL — ABNORMAL HIGH (ref 0.44–1.00)
GFR, Estimated: 49 mL/min — ABNORMAL LOW (ref >60)
Glucose, Bld: 118 mg/dL — ABNORMAL HIGH (ref 70–99)
Potassium: 3.6 mmol/L (ref 3.5–5.1)
Sodium: 137 mmol/L (ref 135–145)

## 2021-12-29 LAB — SEDIMENTATION RATE: Sed Rate: 22 mm/hr (ref 0–22)

## 2021-12-29 LAB — MAGNESIUM: Magnesium: 2.2 mg/dL (ref 1.7–2.4)

## 2021-12-29 LAB — C-REACTIVE PROTEIN: CRP: 0.7 mg/dL (ref <1.0)

## 2021-12-29 LAB — ANA: Anti Nuclear Antibody (ANA): NEGATIVE

## 2021-12-29 MED ORDER — ALUM & MAG HYDROXIDE-SIMETH 200-200-20 MG/5ML PO SUSP
15.0000 mL | ORAL | Status: DC | PRN
  Administered 2021-12-29: 15 mL via ORAL
  Filled 2021-12-29 (×2): qty 30

## 2021-12-29 MED ORDER — POTASSIUM CHLORIDE CRYS ER 20 MEQ PO TBCR
20.0000 meq | EXTENDED_RELEASE_TABLET | ORAL | Status: AC
  Administered 2021-12-29 (×2): 20 meq via ORAL
  Filled 2021-12-29 (×2): qty 1

## 2021-12-29 MED ORDER — SPIRONOLACTONE 25 MG PO TABS
50.0000 mg | ORAL_TABLET | Freq: Every morning | ORAL | Status: DC
Start: 2021-12-29 — End: 2021-12-30
  Administered 2021-12-29 – 2021-12-30 (×2): 50 mg via ORAL
  Filled 2021-12-29 (×3): qty 2

## 2021-12-29 NOTE — Consult Note (Signed)
?Cardiology Consult:  ? ?Patient ID: Rebecca Donovan ?MRN: [id number]; DOB: 1975/08/30  ? ?Admission date: 12/27/2021 ? ?PCP:  Ladell Pier, MD ?  ?[facility] Providers ?Cardiologist:  Former [facility]- > [facility]}   ? ?Chief Complaint:  SOB and pericardial effusion ? ?Patient Profile:  ? ?Rebecca Donovan is a 46 y.o. female with HTN and HFpEF, prior smoking (smoke free for 1.5 years) in the setting of hypertesnive urgency who is being seen 12/29/2021 for the evaluation of new pericardial effusion. ? ?History of Present Illness:  ? ?Rebecca Donovan notes that he was feeling well earlier this year. ? ?In April she was able to walk her dog 3.5 miles without getting short of breath.  Did not notice weight gain, SOB or DOE, No chest pain.  Had issues with hydralazine (made her feel tingly and really dropped her blood pressure) Has had no chest pain, chest pressure, chest tightness, chest stinging.   ? ?Four or five days prior had worsening shortness of breath.  Felt smothered and thought she was having PNA.  She notes that she could even do basic things like folded  laundry.  She noted dry cough without fever or SOB.  She notes a slow and steady weight gain since April. Given this constellation, ED evaluation. ?Notes  no palpitations or funny heart beats.    ? ?BP better controlled with no resolution in her symptoms. ?COVID and Flu negative. ?Given her nebulized with no improvement. ? ?Admitted by [facility]  Give IV diuresis with significant improvement ?Echo with new effusion; cardiology called to assist.  Patient has seen other cardiology in the past but asked to transition ton [facility]. ? ? ?Past Medical History:  ?Diagnosis Date  ? Cancer [facility])   ? Hyperlipidemia   ? Hypertension   ? ? ?Past Surgical History:  ?Procedure Laterality Date  ? CERVIX SURGERY    ?  ? ?Medications Prior to Admission: ?Prior to Admission medications   ?Medication Sig Start Date End Date Taking? Authorizing Provider  ?acetaminophen (TYLENOL) 500  MG tablet Take 1,000 mg by mouth daily as needed (pain).   Yes [provider]  ?albuterol (VENTOLIN HFA) 108 (90 Base) MCG/ACT inhaler Inhale 2 puffs into the lungs See admin instructions. Inhale 2 puffs six times a day as needed for shortness of breath   Yes [provider]  ?amLODipine (NORVASC) 5 MG tablet Take 1 tablet (5 mg total) by mouth daily. 04/06/21  Yes Ladell Pier, MD  ?Ascorbic Acid (VITAMIN C PO) Take 1,500 mg by mouth daily as needed (when feeling sick).   Yes [provider]  ?aspirin-acetaminophen-caffeine (EXCEDRIN MIGRAINE) [phone] MG tablet Take 2 tablets by mouth daily as needed (severe headache).   Yes [provider]  ?carvedilol (COREG) 25 MG tablet Take 1 tablet (25 mg total) by mouth 2 (two) times daily with a meal. 04/06/21  Yes Ladell Pier, MD  ?Coenzyme Q10 (CO Q 10 PO) Take 2 tablets by mouth daily.   Yes [provider]  ?Homeopathic Products ([facility]) CREA Apply 1 application topically daily as needed (bruises).   Yes [provider]  ?hydrALAZINE (APRESOLINE) 100 MG tablet Take 1 tablet (100 mg total) by mouth every 8 (eight) hours. 04/06/21  Yes Ladell Pier, MD  ?losartan (COZAAR) 50 MG tablet Take 1 tablet (50 mg total) by mouth every evening. 04/06/21  Yes Ladell Pier, MD  ?OVER THE COUNTER MEDICATION Take 1 tablet by mouth  daily. Multivitamin   Yes [provider]  ?spironolactone (ALDACTONE) 25 MG tablet Take 1 tablet (25 mg total) by mouth every morning. 04/06/21  Yes Ladell Pier, MD  ?atorvastatin (LIPITOR) 40 MG tablet Take 1 tablet (40 mg total) by mouth at bedtime. ?Patient not taking: Reported on 12/27/2021 09/19/20 12/18/20  Rex Kras, DO  ?sertraline (ZOLOFT) 50 MG tablet 1/2 tab PO daily x4 wks then 1 tab daily ?Patient not taking: Reported on 12/27/2021 04/06/21   Ladell Pier, MD  ?  ? ?Allergies:   No Known Allergies ? ?Social History:   ?Social History   ? ?Socioeconomic History  ? Marital status: Divorced  ?  Spouse name: Not on file  ? Number of children: Not on file  ? Years of education: Not on file  ? Highest education level: Not on file  ?Occupational History  ? Not on file  ?Tobacco Use  ? Smoking status: Never  ? Smokeless tobacco: Never  ?Vaping Use  ? Vaping Use: Never used  ?Substance and Sexual Activity  ? Alcohol use: Never  ? Drug use: Never  ? Sexual activity: Not on file  ?Other Topics Concern  ? Not on file  ?Social History Narrative  ? Not on file  ? ?Social Determinants of Health  ? ?Financial Resource Strain: Not on file  ?Food Insecurity: Not on file  ?Transportation Needs: Not on file  ?Physical Activity: Not on file  ?Stress: Not on file  ?Social Connections: Not on file  ?Intimate Partner Violence: Not on file  ?  ?Family History:   ?The patient's family history includes Cancer in her paternal grandfather; Diabetes in her maternal grandfather; Hypertension in her mother.   ? ?ROS:  ?Please see the history of present illness.  ?All other ROS reviewed and negative.    ? ?Physical Exam/Data:  ? ?Vitals:  ? 12/29/21 0500 12/29/21 0700 12/29/21 0800 12/29/21 0806  ?BP:   (!) 204/115   ?Pulse: 81 90 84   ?Resp: 17 (!) 23 (!) 21   ?Temp:    98.2 ?F (36.8 ?C)  ?TempSrc:    Oral  ?SpO2: 92% 93% 91%   ?Weight: 134.5 kg     ?Height:      ? ? ?Intake/Output Summary (Last 24 hours) at 12/29/2021 0907 ?Last data filed at 12/29/2021 1761 ?Gross per 24 hour  ?Intake --  ?Output 1275 ml  ?Net -1275 ml  ? ? ?  12/29/2021  ?  5:00 AM 12/27/2021  ?  9:01 AM 12/27/2021  ?  8:56 AM  ?Last 3 Weights  ?Weight (lbs) 296 lb 8.3 oz 299 lb 9.7 oz 280 lb  ?Weight (kg) 134.5 kg 135.9 kg 127.007 kg  ?   ?Body mass index is 54.23 kg/m?.  ? ?Gen: No distress, Super morbid obesity   ?Neck: Elevated JVD with 8 cm HJR at 45 degrees ?Cardiac: No Rubs or Gallops, no Murmur, RRR +2 radial pulses ?Respiratory: Right sided wheeze, normal effort, normal  respiratory rate ?GI: Soft,  nontender, non-distended  ?MS: non pitting LL edema;  moves all extremities ?Integument: Skin feels warm ?Neuro:  At time of evaluation, alert and oriented to person/place/time/situation  ?Psych: Normal affect, patient feels anxious ? ? ?EKG:  The ECG that was done  was personally reviewed and demonstrates sinus tachycardia rate 125 with septal infarct pattern ? ? ?Relevant CV Studies: ?CT PE ?1. Mild main pulmonary artery dilation ?2. NO PE ?3. NO CAC or aortic atherosclerosis ?  4. Moderate pericardial effusion without pericardial calcification, largest over the RV anterior ?5. Mild-to-moderate right and mild left pleural effusions with ?Minimal  dependent subsegmental atelectasis. ? ?Severe LVH ?Moderate Effusion ?Severe Dilation LVIDD 6.3 ?Normal Left atrial size ?Diminised medial E' with no septal bounce ?LVOT Paradoxus in the setting of moderate pericardial effusion ?Low normal LVEF ?Estimated LA pressure 28 mm Hg ? ? ? ?Laboratory Data: ? ?High Sensitivity Troponin:   ?Recent Labs  ?Lab 12/27/21 ?1812 12/27/21 ?1914  ?TROPONINIHS 51* 50*  ?    ?Chemistry ?Recent Labs  ?Lab 12/28/21 ?3354 12/29/21 ?0215  ?NA 138 137  ?K 4.2 3.6  ?CL 108 105  ?CO2 23 25  ?GLUCOSE 119* 118*  ?BUN 18 24*  ?CREATININE 1.16* 1.36*  ?CALCIUM 8.6* 8.5*  ?MG  --  2.2  ?GFRNONAA 59* 49*  ?ANIONGAP 7 7  ?  ?Recent Labs  ?Lab 12/27/21 ?0903  ?PROT 7.2  ?ALBUMIN 4.1  ?AST 15  ?ALT 12  ?ALKPHOS 57  ?BILITOT 0.7  ? ?Lipids No results for input(s): CHOL, TRIG, HDL, LABVLDL, LDLCALC, CHOLHDL in the last 168 hours. ?Hematology ?Recent Labs  ?Lab 12/28/21 ?5625 12/29/21 ?0215  ?WBC 7.0 6.4  ?RBC 4.23 4.05  ?HGB 11.0* 10.9*  ?HCT 36.5 34.1*  ?MCV 86.3 84.2  ?MCH 26.0 26.9  ?MCHC 30.1 32.0  ?RDW 16.4* 16.5*  ?PLT 219 208  ? ?Thyroid  ?Recent Labs  ?Lab 12/27/21 ?1812  ?TSH 1.049  ? ?BNP ?Recent Labs  ?Lab 12/27/21 ?1153  ?BNP 90.3  ?  ?DDimer  ?Recent Labs  ?Lab 12/27/21 ?1812  ?DDIMER 0.78*  ? ? ? ?Radiology/Studies:  ?CT Angio Chest Pulmonary  Embolism (PE) W or WO Contrast ? ?Result Date: 12/28/2021 ?CLINICAL DATA:  Pulmonary embolism suspected.  High probability. EXAM: CT ANGIOGRAPHY CHEST WITH CONTRAST TECHNIQUE: Multidetector CT imaging of the chest

## 2021-12-29 NOTE — Progress Notes (Signed)
Patient has had large amount of UOP since given '80mg'$  lasix today spread out over two doses.  ? ?Patient had 6 beat run of vtach, requested magnesium be drawn in addition with already ordered morning labs. AM labs now changed to stat as patient is complaining of cramping in her sides when ambulating to the restroom.  ?

## 2021-12-29 NOTE — Progress Notes (Addendum)
?[facility] Progress Note ? ?Patient: Rebecca Donovan     ?UTM:[id number]  ?DOA: 12/27/2021   ?PCP: Ladell Pier, MD  ? ?  ?  ?Brief hospital course: ? Rebecca Donovan is a 46 y.o. female with medical history of hypertension who presents to the hospital for abdominal pain nausea vomiting and shortness of breath.  Symptoms started yesterday.  She states that she was folding laundry when she noticed a heavy feeling in her upper abdomen associated with shortness of breath.  She felt this progressing today until shortness of breath became severe.  Although in the ED it was noted that she had a high blood pressure, she states that yesterday after her symptoms started, her blood pressure was not elevated.  Due to her symptoms, she was unable to take her medications this morning.  In addition to shortness of breath she is noticing some mild dry cough which started this morning.  She has allergies and has an occasional runny nose but has not had any further symptoms. ?When questioned about review of systems, she admits to noticing new onset left lower extremity swelling.  There is no pain in the left leg.  No prior history in this leg.  No prior history of blood clots.  She does not smoke and she is not on any oral contraceptives.  She has not had any recent long travel. ?  ? ?Subjective:  ?Shortness of breath is improving but she is not back to baseline. Left leg edema also improving. Would like to go home ASAP due to concerns for her daughter being home alone. Tearful.  ? ?Assessment and Plan: ?Principal Problem: ?  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 - cont to diurese with IV Lasix as her symptoms are improving with this ? ?Active Problems: ?Pericardial effusion, idiopathic ?- of note, the effusion is NOT new- had a mod effusion reported on CTA in 12/21 and mild effusion reported on ECHO at the same time ?- ANA negative  ?- has had seasonal allergy symptoms recently and currently has a cough productive of yellow sputum but viral etiology is still low probability ?- Cr mildly elevated but- no signs/symptoms of uremia ?- TSH normal ?- she states she has a h/o ovarian cysts- checking pelvic ultrasound to r/o ovarian hyperstimulation syndrome which is characterized by numerous cysts and is associated with pericardial effusions ? ?Cough with yellow sputum ?- COVID & Influenza PCR neg ?- Resp panell ( 20 pathogens) negative ? - allergies vs viral infection ?- follow ? ?  Hypertensive emergency ?-Resulting in above ?- BP much better controlled   ? ?  Morbid obesity ([place]) ?Body mass index is 54.23 kg/m?. ?-Have discussed plan for weight loss ?  ?  ?DVT prophylaxis:  Lovenox ? ?  Code Status: Full Code  ?Consultants: None ?Level of Care: Level of care: Telemetry ?Disposition Plan:  ?Status is: Inpatient ?Remains inpatient appropriate because: Treating acute heart failure ? ?Objective: ?  ?Vitals:  ? 12/29/21 1051 12/29/21 1100 12/29/21 1127 12/29/21 1200  ?BP: (!) 142/108     ?  Pulse:  73  81  ?Resp:  13    ?Temp:   97.9 ?F (36.6 ?C)   ?TempSrc:   Oral   ?SpO2:  95%  97%  ?Weight:      ?Height:      ? ?Filed Weights  ? 12/27/21 0856 12/27/21 0901 12/29/21 0500  ?Weight: 127 kg 135.9 kg 134.5 kg  ? ?Exam: ?General exam: Appears comfortable  ?HEENT: PERRLA, oral mucosa moist, no sclera icterus or thrush ?Respiratory system: due to body habitus, poor breath sounds- Respiratory effort normal. ?Cardiovascular system: S1 & S2 heard, regular rate and rhythm ?Gastrointestinal system: Abdomen soft, non-tender, nondistended. Normal bowel sounds    ?Central nervous system: Alert and oriented. No focal neurological deficits. ?Extremities: No cyanosis, clubbing or edema ?Skin: No rashes or ulcers ?Psychiatry:  anxiety about returning home ? ? ?Imaging and lab data was personally reviewed ? ? ? CBC: ?Recent Labs  ?Lab 12/27/21 ?0903 12/28/21 ?6553 12/29/21 ?0215  ?WBC 9.9 7.0 6.4  ?NEUTROABS 7.3  --   --   ?HGB 12.7 11.0* 10.9*  ?HCT 39.6 36.5 34.1*  ?MCV 83.2 86.3 84.2  ?PLT 279 219 208  ? ? ?Basic Metabolic Panel: ?Recent Labs  ?Lab 12/27/21 ?0903 12/28/21 ?7482 12/29/21 ?0215  ?NA 138 138 137  ?K 3.7 4.2 3.6  ?CL 107 108 105  ?CO2 '23 23 25  '$ ?GLUCOSE 146* 119* 118*  ?BUN 17 18 24*  ?CREATININE 1.16* 1.16* 1.36*  ?CALCIUM 8.7* 8.6* 8.5*  ?MG  --   --  2.2  ? ? ?GFR: ?Estimated Creatinine Clearance: 69.2 mL/min (A) (by C-G formula based on SCr of 1.36 mg/dL (H)). ? ?Scheduled Meds: ? amLODipine  5 mg Oral Daily  ? atorvastatin  40 mg Oral QHS  ? carvedilol  25 mg Oral BID WC  ? Chlorhexidine Gluconate Cloth  6 each Topical Daily  ? diphenhydrAMINE  50 mg Oral QHS  ? enoxaparin (LOVENOX) injection  60 mg Subcutaneous Q24H  ? furosemide  40 mg Intravenous TID  ? mouth rinse  15 mL Mouth Rinse BID  ? sodium chloride flush  3 mL Intravenous Q12H  ? spironolactone  50 mg Oral q morning  ? ?Continuous Infusions: ? sodium chloride    ? ? ? LOS: 2 days  ? ?Author: ?Debbe Odea  ?12/29/2021 12:30 PM ?   ?

## 2021-12-30 ENCOUNTER — Inpatient Hospital Stay (HOSPITAL_COMMUNITY): Payer: Self-pay

## 2021-12-30 DIAGNOSIS — R778 Other specified abnormalities of plasma proteins: Secondary | ICD-10-CM

## 2021-12-30 DIAGNOSIS — I3139 Other pericardial effusion (noninflammatory): Secondary | ICD-10-CM

## 2021-12-30 DIAGNOSIS — N1832 Chronic kidney disease, stage 3b: Secondary | ICD-10-CM

## 2021-12-30 DIAGNOSIS — E7800 Pure hypercholesterolemia, unspecified: Secondary | ICD-10-CM

## 2021-12-30 DIAGNOSIS — I10 Essential (primary) hypertension: Secondary | ICD-10-CM

## 2021-12-30 LAB — BASIC METABOLIC PANEL
Anion gap: 10 (ref 5–15)
BUN: 23 mg/dL — ABNORMAL HIGH (ref 6–20)
CO2: 26 mmol/L (ref 22–32)
Calcium: 8.7 mg/dL — ABNORMAL LOW (ref 8.9–10.3)
Chloride: 103 mmol/L (ref 98–111)
Creatinine, Ser: 1.42 mg/dL — ABNORMAL HIGH (ref 0.44–1.00)
GFR, Estimated: 46 mL/min — ABNORMAL LOW (ref >60)
Glucose, Bld: 101 mg/dL — ABNORMAL HIGH (ref 70–99)
Potassium: 3.9 mmol/L (ref 3.5–5.1)
Sodium: 139 mmol/L (ref 135–145)

## 2021-12-30 LAB — ECHOCARDIOGRAM LIMITED
Area-P 1/2: 4.68 cm2
Height: 62 in
Weight: 4560.88 oz

## 2021-12-30 LAB — BRAIN NATRIURETIC PEPTIDE: B Natriuretic Peptide: 93.3 pg/mL (ref 0.0–100.0)

## 2021-12-30 MED ORDER — HYDRALAZINE HCL 100 MG PO TABS: 100.0000 mg | ORAL_TABLET | Freq: Three times a day (TID) | ORAL | 1 refills | Status: DC

## 2021-12-30 MED ORDER — SPIRONOLACTONE 50 MG PO TABS
50.0000 mg | ORAL_TABLET | Freq: Every morning | ORAL | 0 refills | Status: DC
Start: 2021-12-31 — End: 2022-01-30

## 2021-12-30 MED ORDER — LOSARTAN POTASSIUM 50 MG PO TABS
50.0000 mg | ORAL_TABLET | Freq: Every evening | ORAL | 1 refills | Status: DC
  Filled 2022-01-30: qty 30, 30d supply, fill #0

## 2021-12-30 MED ORDER — CARVEDILOL 25 MG PO TABS
25.0000 mg | ORAL_TABLET | Freq: Two times a day (BID) | ORAL | 1 refills | Status: DC
  Filled 2022-01-30: qty 60, 30d supply, fill #0

## 2021-12-30 MED ORDER — AMLODIPINE BESYLATE 10 MG PO TABS: 10.0000 mg | ORAL_TABLET | Freq: Every day | ORAL | 0 refills | Status: DC

## 2021-12-30 MED ORDER — FUROSEMIDE 40 MG PO TABS
40.0000 mg | ORAL_TABLET | Freq: Two times a day (BID) | ORAL | Status: DC
Start: 2021-12-30 — End: 2021-12-30
  Administered 2021-12-30: 40 mg via ORAL
  Filled 2021-12-30: qty 1

## 2021-12-30 MED ORDER — AMLODIPINE BESYLATE 10 MG PO TABS
10.0000 mg | ORAL_TABLET | Freq: Every day | ORAL | Status: DC
  Administered 2021-12-30: 10 mg via ORAL
  Filled 2021-12-30: qty 1

## 2021-12-30 MED ORDER — FUROSEMIDE 40 MG PO TABS: 40.0000 mg | ORAL_TABLET | Freq: Every day | ORAL | 0 refills | Status: DC

## 2021-12-30 MED ORDER — ATORVASTATIN CALCIUM 40 MG PO TABS
40.0000 mg | ORAL_TABLET | Freq: Every day | ORAL | 0 refills | Status: DC
  Filled 2022-01-30: qty 30, 30d supply, fill #0

## 2021-12-30 NOTE — Progress Notes (Signed)
[facility] Note ? ?Progress Note ? ?Patient Name: Rebecca Donovan ?Date of Encounter: 12/30/2021 ? ?Primary Cardiologist: None  ? ?Subjective  ? ?Is 5 L net negative since last evaluation.  Notable hypertensive ? ?Patient notes that she has diuresed very well and is fatigued based on this. ? ?No Chest pain, no  ? ? ?Inpatient Medications  ?  ?Scheduled Meds: ? amLODipine  5 mg Oral Daily  ? atorvastatin  40 mg Oral QHS  ? carvedilol  25 mg Oral BID WC  ? Chlorhexidine Gluconate Cloth  6 each Topical Daily  ? diphenhydrAMINE  50 mg Oral QHS  ? enoxaparin (LOVENOX) injection  60 mg Subcutaneous Q24H  ? furosemide  40 mg Intravenous TID  ? mouth rinse  15 mL Mouth Rinse BID  ? sodium chloride flush  3 mL Intravenous Q12H  ? spironolactone  50 mg Oral q morning  ? ?Continuous Infusions: ? sodium chloride    ? ?PRN Meds: ?sodium chloride, acetaminophen **OR** acetaminophen, albuterol, alum & mag hydroxide-simeth, aspirin-acetaminophen-caffeine, guaiFENesin-dextromethorphan, labetalol, ondansetron **OR** ondansetron (ZOFRAN) IV, sodium chloride flush  ? ?Vital Signs  ?  ?Vitals:  ? 12/30/21 0500 12/30/21 0600 12/30/21 0700 12/30/21 0800  ?BP:    (!) 155/97  ?Pulse: 76 69 66 78  ?Resp:    (!) 24  ?Temp:    98.2 ?F (36.8 ?C)  ?TempSrc:    Oral  ?SpO2: 92% 90% 91% 95%  ?Weight:      ?Height:      ? ? ?Intake/Output Summary (Last 24 hours) at 12/30/2021 0857 ?Last data filed at 12/30/2021 0740 ?Gross per 24 hour  ?Intake --  ?Output 5750 ml  ?Net -5750 ml  ? ?Filed Weights  ? 12/27/21 0901 12/29/21 0500 12/30/21 0413  ?Weight: 135.9 kg 134.5 kg 129.3 kg  ? ? ?Telemetry  ?  ?One 8 beat run of NSVT  - Personally Reviewed ? ?Physical Exam  ? ?Gen: no distress, morbid obesity   ?Neck: No JVD, no carotid bruit ?Ears:  Pilar Plate Sign ?Cardiac: No Rubs or Gallops, no Murmur, RRR cardia, +2 radial pulses ?Respiratory: Clear to auscultation bilaterally, normal effort, normal  respiratory rate ?GI: Soft, nontender, non-distended   ?MS: No  edema;  moves all extremities ?Integument: Skin feels warm ?Neuro:  At time of evaluation, alert and oriented to person/place/time/situation  ?Psych: Normal affect, patient feels well ? ? ?Labs  ?  ?Chemistry ?Recent Labs  ?Lab 12/27/21 ?0903 12/28/21 ?2979 12/29/21 ?0215 12/30/21 ?0251  ?NA 138 138 137 139  ?K 3.7 4.2 3.6 3.9  ?CL 107 108 105 103  ?CO2 '23 23 25 26  '$ ?GLUCOSE 146* 119* 118* 101*  ?BUN 17 18 24* 23*  ?CREATININE 1.16* 1.16* 1.36* 1.42*  ?CALCIUM 8.7* 8.6* 8.5* 8.7*  ?PROT 7.2  --   --   --   ?ALBUMIN 4.1  --   --   --   ?AST 15  --   --   --   ?ALT 12  --   --   --   ?ALKPHOS 57  --   --   --   ?BILITOT 0.7  --   --   --   ?GFRNONAA 59* 59* 49* 46*  ?ANIONGAP '8 7 7 10  '$ ?  ? ?Hematology ?Recent Labs  ?Lab 12/27/21 ?0903 12/28/21 ?8921 12/29/21 ?0215  ?WBC 9.9 7.0 6.4  ?RBC 4.76 4.23 4.05  ?HGB 12.7 11.0* 10.9*  ?HCT 39.6 36.5 34.1*  ?MCV 83.2  86.3 84.2  ?MCH 26.7 26.0 26.9  ?MCHC 32.1 30.1 32.0  ?RDW 16.4* 16.4* 16.5*  ?PLT 279 219 208  ? ? ?Cardiac EnzymesNo results for input(s): TROPONINI in the last 168 hours. No results for input(s): TROPIPOC in the last 168 hours.  ? ?BNP ?Recent Labs  ?Lab 12/27/21 ?1153 12/30/21 ?0251  ?BNP 90.3 93.3  ?  ? ?DDimer  ?Recent Labs  ?Lab 12/27/21 ?1812  ?DDIMER 0.78*  ?  ? ?Radiology  ?  ?CT Angio Chest Pulmonary Embolism (PE) W or WO Contrast ? ?Result Date: 12/28/2021 ?CLINICAL DATA:  Pulmonary embolism suspected.  High probability. EXAM: CT ANGIOGRAPHY CHEST WITH CONTRAST TECHNIQUE: Multidetector CT imaging of the chest was performed using the standard protocol during bolus administration of intravenous contrast. Multiplanar CT image reconstructions and MIPs were obtained to evaluate the vascular anatomy. RADIATION DOSE REDUCTION: This exam was performed according to the departmental dose-optimization program which includes automated exposure control, adjustment of the mA and/or kV according to patient size and/or use of iterative reconstruction  technique. CONTRAST:  156m OMNIPAQUE IOHEXOL 350 MG/ML SOLN COMPARISON:  Chest two views 12/27/2021 and 10/12/2020; CT abdomen and pelvis 12/27/2021; CTA chest 08/16/2020 FINDINGS: Cardiovascular: The main pulmonary artery is opacified up to 206 Hounsfield units. This is a suboptimal contrast bolus timing. No filling defect is seen within the main left main right main, or lobar pulmonary arteries. Evaluation of the more distal pulmonary arteries is limited by contrast bolus timing. No definitive pulmonary embolism is seen. Heart size is mild-to-moderately enlarged. A small pericardial effusion is similar to 08/16/2020 prior CT. No thoracic aortic aneurysm. Mediastinum/Nodes: No axillary, mediastinal, or hilar pathologically enlarged lymph nodes by CT criteria. The visualized thyroid is unremarkable. The esophagus follows a normal course of normal caliber. Lungs/Pleura: The central airways are patent. Compared to 12/27/2021 CT abdomen and pelvis, there is no significant change in mild-to-moderate right and mild left pleural effusions. The prior lateral right costophrenic angle airspace opacity appears to no longer be present in the same location, likely previously atelectasis. There are patchy bilateral ground-glass opacities likely representing the mild interstitial pulmonary edema seen on prior radiographs. There is bilateral lower lung posterior dependent subsegmental atelectasis. Upper Abdomen: The spleen measures 18 cm in AP dimension, again markedly enlarged. Musculoskeletal: Mild-to-moderate multilevel degenerative disc changes of the thoracic spine. Review of the MIP images confirms the above findings. IMPRESSION:: IMPRESSION: 1. No large central pulmonary embolism is seen. Evaluation of the segmental and more distal pulmonary arteries is limited by contrast bolus timing. 2. Mild-to-moderate cardiomegaly, unchanged. Mild pericardial effusion is unchanged from 08/16/2020. 3. Mild-to-moderate right and mild  left pleural effusions with posterior dependent subsegmental atelectasis. 4. Mild pulmonary edema. Electronically Signed   By: RYvonne KendallM.D.   On: 12/28/2021 11:30  ? ?ECHOCARDIOGRAM COMPLETE ? ?Result Date: 12/28/2021 ?   ECHOCARDIOGRAM REPORT   Patient Name:   Rebecca ABRAMSDate of Exam: 12/28/2021 Medical Rec #:  [id number]  Height:       62.0 in Accession #:    [id number] Weight:       280.0 lb Date of Birth:  1Feb 24, 1977  BSA:          2.206 m? Patient Age:    89years    BP:           162/92 mmHg Patient Gender: F           HR:  84 bpm. Exam Location:  Inpatient Procedure: 2D Echo, Cardiac Doppler and Color Doppler Indications:    Acute resp. distress  History:        Patient has prior history of Echocardiogram examinations, most                 recent 08/17/2020.  Sonographer:    Joette Catching RCS Referring Phys: [place]  1. Left ventricular ejection fraction, by estimation, is 50 to 55%. The left ventricle has low normal function. The left ventricle has no regional wall motion abnormalities. The left ventricular internal cavity size was moderately dilated. Left ventricular diastolic parameters are consistent with Grade III diastolic dysfunction (restrictive). Elevated left ventricular end-diastolic pressure. The E/e' is 24.  2. Right ventricular systolic function is normal. The right ventricular size is normal. Tricuspid regurgitation signal is inadequate for assessing PA pressure.  3. Left atrial size was mildly dilated.  4. Some echocardiographic features of tamponade physiology are noted -clinical correlation is advised. a small pericardial effusion is present. The pericardial effusion is circumferential.  5. The mitral valve is abnormal. Mild to moderate mitral valve regurgitation.  6. The aortic valve is tricuspid. Aortic valve regurgitation is not visualized.  7. The inferior vena cava is dilated in size with <50% respiratory variability, suggesting right atrial  pressure of 15 mmHg. Comparison(s): Prior images reviewed side by side. Changes from prior study are noted. 08/06/2020: LVEF 45-50%, grade 2 DD, small pericardial effusion. Visual comparison suggests the effusion in the

## 2021-12-30 NOTE — Discharge Summary (Signed)
Physician Discharge Summary  ?Cedar Roseman XKG:[id number] DOB: 06/12/1976 DOA: 12/27/2021 ? ?PCP: Ladell Pier, MD ? ?Admit date: 12/27/2021 ?Discharge date: 12/30/2021 ?Discharging to: home ?Recommendations for Outpatient Follow-up:  ?F/u on renal function ?Encourage weight loss ? ?Consults:  ?cardiology ?Procedures:  ?2 D ECHO ? ? ?Discharge Diagnoses:  ? Principal Problem: ?  Acute on chronic combined systolic and diastolic CHF (congestive heart failure) ([place]) ?Active Problems: ?  Acute respiratory distress ?  Stage 3b chronic kidney disease (CKD) ([place]) ?  Pericardial effusion ?  Hypertensive emergency ?  Elevated troponin level not due to acute coronary syndrome ?  Morbid obesity ([place]) ? ? ? ? ?Hospital Course:  ?Rebecca Donovan is a 46 y.o. female with medical history of hypertension who presents to the hospital for abdominal pain nausea vomiting and shortness of breath.  Symptoms started yesterday.  She states that she was folding laundry when she noticed a heavy feeling in her upper abdomen associated with shortness of breath.  She felt this progressing today until shortness of breath became severe.  Although in the ED it was noted that she had a high blood pressure, she states that yesterday after her symptoms started, her blood pressure was not elevated.  Due to her symptoms, she was unable to take her medications this morning.  In addition to shortness of breath she is noticing some mild dry cough which started this morning.  She has allergies and has an occasional runny nose but has not had any further symptoms. ?When questioned about review of systems, she admits to noticing new onset left lower extremity swelling.  There is no pain in the left leg.  No prior history in this leg.  No prior history of blood clots.  She does not smoke and she is not on any oral contraceptives.  She has not had any recent long travel. ? ?Principal Problem: ?  Acute respiratory distress ?- I believe that this may be due to acute  on chronic combined systolic and diastolic heart  ?- I have discussed the plan with the patient and have started Lasix   ?-2D echo from 2021 revealed an EF of 45 to 50% and grade 2 diastolic dysfunction ?- 2D echo now reveals a pericardial effusion- mod to large- clinically, she has had no signs of tamponade ?-  Limited ECHO repeated today> EF 70-26%, Grade 2 diastolic dysfunction and small pericardial effusion, Mild to moderate mitral valve  ?regurgitation. ?- she has lost 13 lbs with diuresis and is now stable to go home ?  ?Active Problems: ?Pericardial effusion, idiopathic ?- of note, the effusion is NOT new- had a mod effusion reported on CTA in 12/21 and mild effusion reported on ECHO at the same time ?- ANA negative  ?- has had seasonal allergy symptoms recently and currently has a cough productive of yellow sputum but viral etiology is still low probability ?- Cr mildly elevated but- no signs/symptoms of uremia ?- TSH normal ?- she states she has a h/o ovarian cysts-  pelvic ultrasound  shows once cyst ?- r/o ovarian hyperstimulation syndrome which is characterized by numerous cysts and is associated with pericardial effusions ? - repeat echo noted above ? ?  Hypertensive emergency ?-Resulting in above ?- BP much better controlled  - 155/97 today ?  ?Cough with yellow sputum ?- COVID & Influenza PCR neg ?- Resp panel ( 20 pathogens) negative ? - allergies vs viral infection ?-  she tells me today that this has resolved ?   ?  Morbid obesity ([place]) ?Body mass index is 54.23 kg/m?. ?-Have discussed plan for weight loss ?  ? ? ?  ? ? ?  ? ?Discharge Instructions ? ?Discharge Instructions   ? ? (HEART FAILURE PATIENTS) Call MD:  Anytime you have any of the following symptoms: 1) 3 pound weight gain in 24 hours or 5 pounds in 1 week 2) shortness of breath, with or without a dry hacking cough 3) swelling in the hands, feet or stomach 4) if you have to sleep on extra pillows at night in order to breathe.   Complete  by: As directed ?  ? Amb Referral to Phase II Cardiac  Rehab   Complete by: As directed ?  ? Diagnosis: Heart Failure (see criteria below if ordering Phase II)  ? Heart Failure Type: Chronic Systolic & Diastolic  ? After initial evaluation and assessments completed: Virtual Based Care may be provided alone or in conjunction with Phase 2 Cardiac Rehab based on patient barriers.: Yes  ? Diet - low sodium heart healthy   Complete by: As directed ?  ? Increase activity slowly   Complete by: As directed ?  ? ?  ? ?Allergies as of 12/30/2021   ?No Known Allergies ?  ? ?  ?Medication List  ?  ? ?TAKE these medications   ? ?acetaminophen 500 MG tablet ?Commonly known as: TYLENOL ?Take 1,000 mg by mouth daily as needed (pain). ?  ?albuterol 108 (90 Base) MCG/ACT inhaler ?Commonly known as: VENTOLIN HFA ?Inhale 2 puffs into the lungs See admin instructions. Inhale 2 puffs six times a day as needed for shortness of breath ?  ?amLODipine 10 MG tablet ?Commonly known as: NORVASC ?Take 1 tablet (10 mg total) by mouth daily. ?Start taking on: Dec 31, 2021 ?What changed:  ?medication strength ?how much to take ?  ?Arnicare Arnica Crea ?Apply 1 application topically daily as needed (bruises). ?  ?aspirin-acetaminophen-caffeine 250-250-65 MG tablet ?Commonly known as: [place] ?Take 2 tablets by mouth daily as needed (severe headache). ?  ?atorvastatin 40 MG tablet ?Commonly known as: LIPITOR ?Take 1 tablet (40 mg total) by mouth at bedtime. ?  ?carvedilol 25 MG tablet ?Commonly known as: COREG ?Take 1 tablet (25 mg total) by mouth 2 (two) times daily with a meal. ?  ?CO Q 10 PO ?Take 2 tablets by mouth daily. ?  ?furosemide 40 MG tablet ?Commonly known as: LASIX ?Take 1 tablet (40 mg total) by mouth daily. ?  ?hydrALAZINE 100 MG tablet ?Commonly known as: APRESOLINE ?Take 1 tablet (100 mg total) by mouth every 8 (eight) hours. ?  ?losartan 50 MG tablet ?Commonly known as: COZAAR ?Take 1 tablet (50 mg total) by mouth every  evening. ?  ?OVER THE COUNTER MEDICATION ?Take 1 tablet by mouth daily. Multivitamin ?  ?sertraline 50 MG tablet ?Commonly known as: Zoloft ?1/2 tab PO daily x4 wks then 1 tab daily ?  ?spironolactone 50 MG tablet ?Commonly known as: ALDACTONE ?Take 1 tablet (50 mg total) by mouth every morning. ?Start taking on: Dec 31, 2021 ?What changed:  ?medication strength ?how much to take ?  ?VITAMIN C PO ?Take 1,500 mg by mouth daily as needed (when feeling sick). ?  ? ?  ? ? Follow-up Information   ? ? [facility] Follow up.   ?Why: Call as soon as possible to set up an after hospital visit and to get established with a PCP. ?Contact information: ?[address]  [place] ?[place] [id number] ?[phone] ? ?  ?  ? ?  ?  ? ?  ? ?  ?  ?The results of significant diagnostics from this hospitalization (including imaging, microbiology, ancillary and laboratory) are listed below for reference.   ? ?DG Chest 2 View ? ?Result Date: 12/27/2021 ?CLINICAL DATA:  Right lower lesion, possible pneumonia. EXAM: CHEST - 2 VIEW COMPARISON:  Radiographs 10/12/2020. Abdominopelvic CT 12/27/2021 and chest CT 08/16/2020. FINDINGS: Mild enlargement of the cardiac silhouette, corresponding with a small pericardial effusion on CT. There is a small right pleural effusion with vascular congestion and possible mild pulmonary edema. The subpleural airspace disease at the right lateral lung base seen on earlier abdominal CT is not well visualized. There is no pneumothorax. The bones appear unremarkable. Telemetry leads overlie the chest. IMPRESSION: Enlarged cardiac silhouette, mild pulmonary edema and small right pleural effusion, suggesting possible mild congestive heart failure. No focal airspace disease identified radiographically. See earlier abdominal CT report. Electronically Signed   By: Richardean Sale M.D.   On: 12/27/2021 11:31  ? ?CT Angio Chest Pulmonary Embolism (PE) W or WO  Contrast ? ?Result Date: 12/28/2021 ?CLINICAL DATA:  Pulmonary embolism suspected.  High probability. EXAM: CT ANGIOGRAPHY CHEST WITH CONTRAST TECHNIQUE: Multidetector CT imaging of the chest was performed using t

## 2021-12-30 NOTE — Progress Notes (Signed)
?  Echocardiogram ?2D Echocardiogram has been performed. ? ?Fidel Levy ?12/30/2021, 8:54 AM ?

## 2022-01-01 ENCOUNTER — Telehealth: Payer: Self-pay

## 2022-01-01 NOTE — Telephone Encounter (Signed)
Transition Care Management Unsuccessful Follow-up Telephone Call ? ?Date of discharge and from where:  12/30/2021, [facility]  ? ?Attempts:  1st Attempt ? ?Reason for unsuccessful TCM follow-up call:  Left voice message [phone], call back requested.  ? ?Need to discuss scheduling a hospital follow up appointment with PCP ? ? ? ?

## 2022-01-02 ENCOUNTER — Telehealth: Payer: Self-pay

## 2022-01-02 NOTE — Telephone Encounter (Signed)
Transition Care Management Follow-up Telephone Call ?Date of discharge and from where: 12/30/2021, [facility]  ?How have you been since you were released from the hospital? She said so far she is feeling pretty good, just tired.  ?Any questions or concerns? Yes- she stated that she needs a referral to cardiology ? ?Items Reviewed: ?Did the pt receive and understand the discharge instructions provided? Yes  ?Medications obtained and verified? Yes - She stated that she picked up her new medication and was currently in line at her pharmacy picking up refills of her other medications.  She is uninsured and concerned about the cost of the medications.  I explained that going forward if she uses [facility] she may be eligible for patient assistance programs to help her obtain her medications as little or no cost.  ?Other? No  ?Any new allergies since your discharge? No  ?Dietary orders reviewed? Yes ?Do you have support at home? Yes - her daughter ? ?Home Care and Equipment/Supplies: ?Were home health services ordered? no ?If so, what is the name of the agency? N/a  ?Has the agency set up a time to come to the patient's home? not applicable ?Were any new equipment or medical supplies ordered?  No ?What is the name of the medical supply agency? N/a ?Were you able to get the supplies/equipment? not applicable ?Do you have any questions related to the use of the equipment or supplies? No ? ?Functional Questionnaire: (I = Independent and D = Dependent) ?ADLs: independent.  She has a scale and has been weighing herself at the same time daily and said she has actually lost 2 lbs. She understands when to call provider with weight gain  ? ?Follow up appointments reviewed: ? ?PCP Hospital f/u appt confirmed? Yes  Scheduled to see Dr Wynetta Emery  - 01/16/2022.  ?[facility] f/u appt confirmed?  None scheduled at this time    ?Are transportation arrangements needed? No  ?If their condition worsens, is  the pt aware to call PCP or go to the Emergency Dept.? Yes ?Was the patient provided with contact information for the PCP's office or ED? Yes ?Was to pt encouraged to call back with questions or concerns? Yes ? ?

## 2022-01-16 ENCOUNTER — Ambulatory Visit: Payer: Self-pay | Attending: Internal Medicine | Admitting: Internal Medicine

## 2022-01-16 ENCOUNTER — Encounter: Payer: Self-pay | Admitting: Internal Medicine

## 2022-01-16 ENCOUNTER — Other Ambulatory Visit: Payer: Self-pay

## 2022-01-16 VITALS — BP 140/95 | HR 94 | Temp 97.9°F | Ht 62.0 in | Wt 276.3 lb

## 2022-01-16 DIAGNOSIS — R0982 Postnasal drip: Secondary | ICD-10-CM

## 2022-01-16 DIAGNOSIS — Z09 Encounter for follow-up examination after completed treatment for conditions other than malignant neoplasm: Secondary | ICD-10-CM

## 2022-01-16 DIAGNOSIS — N1832 Chronic kidney disease, stage 3b: Secondary | ICD-10-CM

## 2022-01-16 DIAGNOSIS — I5042 Chronic combined systolic (congestive) and diastolic (congestive) heart failure: Secondary | ICD-10-CM

## 2022-01-16 DIAGNOSIS — J301 Allergic rhinitis due to pollen: Secondary | ICD-10-CM

## 2022-01-16 DIAGNOSIS — I10 Essential (primary) hypertension: Secondary | ICD-10-CM

## 2022-01-16 MED ORDER — HYDRALAZINE HCL 100 MG PO TABS
50.0000 mg | ORAL_TABLET | Freq: Three times a day (TID) | ORAL | 6 refills | Status: DC
  Filled 2022-01-16: qty 45, 30d supply, fill #0

## 2022-01-16 MED ORDER — FLUTICASONE PROPIONATE 50 MCG/ACT NA SUSP
1.0000 | Freq: Every day | NASAL | 2 refills | Status: AC
  Filled 2022-01-16: qty 16, 25d supply, fill #0

## 2022-01-16 MED ORDER — LOSARTAN POTASSIUM 25 MG PO TABS
25.0000 mg | ORAL_TABLET | Freq: Every day | ORAL | 5 refills | Status: DC
  Filled 2022-01-16: qty 30, 30d supply, fill #0
  Filled 2022-02-28: qty 30, 30d supply, fill #1
  Filled 2022-03-29: qty 30, 30d supply, fill #2
  Filled 2022-05-01: qty 30, 30d supply, fill #3

## 2022-01-16 NOTE — Progress Notes (Signed)
Cough,nasal congestion,sore throat. Daughter was recently sick.

## 2022-01-16 NOTE — Patient Instructions (Signed)
Decrease Hydralazine to 50 mg three times a day. Increase Losartan to 75 mg daily.  You will take a 50 mg and a 25 mg tablet daily.  Use the Flonase nasal spray.  Continue Zyrtec from over the counter.

## 2022-01-16 NOTE — Progress Notes (Signed)
Patient ID: Rebecca Donovan, female    DOB: [birth year]  MRN: [id number]  CC: Hospitalization Follow-up   Subjective: Rebecca Donovan is a 46 y.o. female who presents for hosp f/u Her concerns today include:  Patient with history of HTN, morbid obesity,  cardiomyopathy with combined diastolic and systolic CHF (ON62-95% increased to 50-55%),  CKD 3b, HL, migraines, asthma, MDD/anxiety, Anemia  Patient hospitalized 5/10-13/2023 with acute on chronic combined systolic/diastolic CHF, hypertensive emergency and acute respiratory distress. Patient was diuresed.  2D echo revealed EF 50 to 55%, G2 DD and small pericardial effusion with mild to moderate mitral valve regurg.  She lost 13 pounds with diuresis.  In regards to the pericardial effusion, ANA was negative, TSH normal, HIV negative.  She had pelvic ultrasound done to rule out hyperstimulation ovarian syndrome which can be associated with pericardial effusions.  This revealed simple left ovarian cyst for which no further follow-up was recommended.  Patient had cough productive of yellow phlegm.  She tested negative for COVID and influenza.  Respiratory panel negative. Kidney function not at 100%.  GFR during hospitalization ranged between 46-59.  Creatinine at the time of discharge was 1.42.  Creatinine in August of last year was 1.1 with GFR of 64  Today: CHF/HTN: Reports compliance with taking her medications including amlodipine, carvedilol, Cozaar, hydralazine and spironolactone.  She is on hydralazine 100 mg 3 times a day.  She reports getting nauseated and having a sick feeling when she takes the hydralazine. -Checks blood pressure every morning.  Reports range of 120-130/70-90 using an arm device. Limit salt in the foods. No chest pains or lower extremity edema.  Obesity: Reports she has completely changed the way she eats.  She does not eat beef or pork.  Eat chicken, [place], salmon and tuna.  Incorporates lots of fruits and veggies.  Drinks  mainly water and occasionally cranberry juice and ice tea.  Eats brown rice.  Complains of nasal congestion with coughing since this past weekend.  Started 4 days ago with discomfort in her throat.  She has been having lots of sinus drainage and itchy throat.  No shortness of breath.  Cough is worse at night and it is a wet cough.  She has been using Coricidin HBP and took Benadryl once.  Neither seems to help.  Home COVID test was negative.  Her 110 year old daughter was sick last week but also tested negative for COVID.  She has Zyrtec and has been taking 1 daily.  Patient Active Problem List   Diagnosis Date Noted   Stage 3b chronic kidney disease (CKD) ([place]) 12/30/2021   Acute respiratory distress 12/27/2021   Essential hypertension 04/06/2021   Acute on chronic combined systolic and diastolic CHF (congestive heart failure) ([place]) 04/06/2021   Insomnia due to other mental disorder 04/06/2021   Major depressive disorder, single episode, moderate ([place]) 04/06/2021   GAD (generalized anxiety disorder) 04/06/2021   Cardiomyopathy ([place])    Hypertensive emergency    SOB (shortness of breath)    Elevated troponin level not due to acute coronary syndrome    Community acquired pneumonia    Pericardial effusion    Morbid obesity ([place])    Elevated brain natriuretic peptide (BNP) level    Hypertensive urgency 08/16/2020     Current Outpatient Medications on File Prior to Visit  Medication Sig Dispense Refill   acetaminophen (TYLENOL) 500 MG tablet Take 1,000 mg by mouth daily as needed (pain).     albuterol (VENTOLIN HFA)  108 (90 Base) MCG/ACT inhaler Inhale 2 puffs into the lungs See admin instructions. Inhale 2 puffs six times a day as needed for shortness of breath     amLODipine (NORVASC) 10 MG tablet Take 1 tablet (10 mg total) by mouth daily. 30 tablet 0   Ascorbic Acid (VITAMIN C PO) Take 1,500 mg by mouth daily as needed (when feeling sick).     aspirin-acetaminophen-caffeine (EXCEDRIN  MIGRAINE) 250-250-65 MG tablet Take 2 tablets by mouth daily as needed (severe headache).     atorvastatin (LIPITOR) 40 MG tablet Take 1 tablet (40 mg total) by mouth at bedtime. 90 tablet 0   carvedilol (COREG) 25 MG tablet Take 1 tablet (25 mg total) by mouth 2 (two) times daily with a meal. 180 tablet 1   Coenzyme Q10 (CO Q 10 PO) Take 2 tablets by mouth daily.     furosemide (LASIX) 40 MG tablet Take 1 tablet (40 mg total) by mouth daily. 30 tablet 0   Homeopathic Products (ARNICARE ARNICA) CREA Apply 1 application topically daily as needed (bruises).     losartan (COZAAR) 50 MG tablet Take 1 tablet (50 mg total) by mouth every evening. 90 tablet 1   OVER THE COUNTER MEDICATION Take 1 tablet by mouth daily. Multivitamin     sertraline (ZOLOFT) 50 MG tablet 1/2 tab PO daily x4 wks then 1 tab daily 30 tablet 1   spironolactone (ALDACTONE) 50 MG tablet Take 1 tablet (50 mg total) by mouth every morning. 30 tablet 0   No current facility-administered medications on file prior to visit.    No Known Allergies  Social History   Socioeconomic History   Marital status: Divorced    Spouse name: Not on file   Number of children: Not on file   Years of education: Not on file   Highest education level: Not on file  Occupational History   Not on file  Tobacco Use   Smoking status: Never   Smokeless tobacco: Never  Vaping Use   Vaping Use: Never used  Substance and Sexual Activity   Alcohol use: Never   Drug use: Never   Sexual activity: Not on file  Other Topics Concern   Not on file  Social History Narrative   Not on file   Social Determinants of Health   Financial Resource Strain: Not on file  Food Insecurity: Not on file  Transportation Needs: Not on file  Physical Activity: Not on file  Stress: Not on file  Social Connections: Not on file  Intimate Partner Violence: Not on file    Family History  Problem Relation Age of Onset   Hypertension Mother    Diabetes Maternal  Grandfather    Cancer Paternal Grandfather     Past Surgical History:  Procedure Laterality Date   CERVIX SURGERY      ROS: Review of Systems Negative except as stated above  PHYSICAL EXAM: BP (!) 140/95   Pulse 94   Temp 97.9 F (36.6 C) (Oral)   Ht '5\' 2"'$  (1.575 m)   Wt 276 lb 4.8 oz (125.3 kg)   SpO2 97%   BMI 50.54 kg/m   Wt Readings from Last 3 Encounters:  01/16/22 276 lb 4.8 oz (125.3 kg)  12/30/21 285 lb 0.9 oz (129.3 kg)  04/06/21 275 lb (124.7 kg)    Physical Exam  General appearance - alert, well appearing, obese middle-age Caucasian female and in no distress.  She has coughing spasms in my presence.  Mental status - normal mood, behavior, speech, dress, motor activity, and thought processes Nose -mild enlargement of nasal turbinates Mouth - mucous membranes moist, pharynx normal without lesions Neck - supple, no significant adenopathy Chest - clear to auscultation, no wheezes, rales or rhonchi, symmetric air entry Heart - normal rate, regular rhythm, normal S1, S2, no murmurs, rubs, clicks or gallops Extremities - peripheral pulses normal, no pedal edema, no clubbing or cyanosis      Latest Ref Rng & Units 12/30/2021    2:51 AM 12/29/2021    2:15 AM 12/28/2021    2:40 AM  CMP  Glucose 70 - 99 mg/dL 101   118   119    BUN 6 - 20 mg/dL '23   24   18    '$ Creatinine 0.44 - 1.00 mg/dL 1.42   1.36   1.16    Sodium 135 - 145 mmol/L 139   137   138    Potassium 3.5 - 5.1 mmol/L 3.9   3.6   4.2    Chloride 98 - 111 mmol/L 103   105   108    CO2 22 - 32 mmol/L '26   25   23    '$ Calcium 8.9 - 10.3 mg/dL 8.7   8.5   8.6     Lipid Panel     Component Value Date/Time   CHOL 216 (H) 08/18/2020 0421   TRIG 168 (H) 08/18/2020 0421   HDL 46 08/18/2020 0421   CHOLHDL 4.7 08/18/2020 0421   VLDL 34 08/18/2020 0421   LDLCALC 136 (H) 08/18/2020 0421    CBC    Component Value Date/Time   WBC 6.4 12/29/2021 0215   RBC 4.05 12/29/2021 0215   HGB 10.9 (L) 12/29/2021  0215   HCT 34.1 (L) 12/29/2021 0215   PLT 208 12/29/2021 0215   MCV 84.2 12/29/2021 0215   MCH 26.9 12/29/2021 0215   MCHC 32.0 12/29/2021 0215   RDW 16.5 (H) 12/29/2021 0215   LYMPHSABS 1.5 12/27/2021 0903   MONOABS 0.7 12/27/2021 0903   EOSABS 0.3 12/27/2021 0903   BASOSABS 0.1 12/27/2021 0903    ASSESSMENT AND PLAN: 1. Hospital discharge follow-up   2. Chronic combined systolic and diastolic congestive heart failure (HCC) Compensated.  Weight is down additional 9 pounds since hospital. Continue to limit salt. - hydrALAZINE (APRESOLINE) 100 MG tablet; Take 0.5 tablets (50 mg total) by mouth every 8 (eight) hours.  Dispense: 45 tablet; Refill: 6  3. Essential hypertension Not at goal today but reported blood pressure readings at home and not bad.  She is having some issues with the hydralazine.  We discussed cutting the dose in half from 100 mg to 50 mg.  We will then increase the dose of the Cozaar instead to 75 mg daily.  Advised to continue to monitor blood pressure. - losartan (COZAAR) 25 MG tablet; Take 1 tablet (25 mg total) by mouth daily. Take 1 tab PO daily with the 50 mg tab for a total of 75 mg daily total.  Dispense: 30 tablet; Refill: 5 - hydrALAZINE (APRESOLINE) 100 MG tablet; Take 0.5 tablets (50 mg total) by mouth every 8 (eight) hours.  Dispense: 45 tablet; Refill: 6  4. Morbid obesity ([place]) Commended her on trying to eat healthy.  Encouraged her to keep up the good work.  Try to move as much as she can.  5. Stage 3b chronic kidney disease (HCC) GFR last year was higher.  Change [place] is  likely due to cardiorenal syndrome with her being on diuretics.  We will continue to monitor.  6. Postnasal drip - fluticasone (FLONASE) 50 MCG/ACT nasal spray; Place 1 spray into both nostrils daily.  Dispense: 16 g; Refill: 2  7. Seasonal allergic rhinitis due to pollen - fluticasone (FLONASE) 50 MCG/ACT nasal spray; Place 1 spray into both nostrils daily.  Dispense: 16 g;  Refill: 2    Patient was given the opportunity to ask questions.  Patient verbalized understanding of the plan and was able to repeat key elements of the plan.   This documentation was completed using Radio producer.  Any transcriptional errors are unintentional.  No orders of the defined types were placed in this encounter.    Requested Prescriptions   Signed Prescriptions Disp Refills   fluticasone (FLONASE) 50 MCG/ACT nasal spray 16 g 2    Sig: Place 1 spray into both nostrils daily.   losartan (COZAAR) 25 MG tablet 30 tablet 5    Sig: Take 1 tablet (25 mg total) by mouth daily. Take 1 tab PO daily with the 50 mg tab for a total of 75 mg daily total.   hydrALAZINE (APRESOLINE) 100 MG tablet 45 tablet 6    Sig: Take 0.5 tablets (50 mg total) by mouth every 8 (eight) hours.    Return in about 5 weeks (around 02/20/2022) for PAP and BP recheck.  Karle Plumber, MD, FACP

## 2022-01-19 ENCOUNTER — Other Ambulatory Visit: Payer: Self-pay

## 2022-01-25 ENCOUNTER — Inpatient Hospital Stay: Payer: Self-pay | Admitting: Physician Assistant

## 2022-01-30 ENCOUNTER — Other Ambulatory Visit: Payer: Self-pay | Admitting: Internal Medicine

## 2022-01-30 ENCOUNTER — Other Ambulatory Visit: Payer: Self-pay

## 2022-01-30 MED ORDER — LOSARTAN POTASSIUM 50 MG PO TABS
50.0000 mg | ORAL_TABLET | Freq: Every evening | ORAL | 1 refills | Status: DC
  Filled 2022-01-30: qty 30, 30d supply, fill #0
  Filled 2022-02-28: qty 30, 30d supply, fill #1
  Filled 2022-03-29: qty 30, 30d supply, fill #2
  Filled 2022-05-01: qty 30, 30d supply, fill #3

## 2022-01-30 MED ORDER — ATORVASTATIN CALCIUM 40 MG PO TABS
40.0000 mg | ORAL_TABLET | Freq: Every day | ORAL | 0 refills | Status: DC
  Filled 2022-01-30: qty 30, 30d supply, fill #0
  Filled 2022-02-28: qty 30, 30d supply, fill #1

## 2022-01-30 MED ORDER — HYDRALAZINE HCL 100 MG PO TABS
100.0000 mg | ORAL_TABLET | Freq: Three times a day (TID) | ORAL | 3 refills | Status: DC
  Filled 2022-01-30: qty 90, 30d supply, fill #0

## 2022-01-30 MED ORDER — CARVEDILOL 25 MG PO TABS
25.0000 mg | ORAL_TABLET | Freq: Two times a day (BID) | ORAL | 1 refills | Status: DC
  Filled 2022-01-30: qty 60, 30d supply, fill #0
  Filled 2022-02-28: qty 60, 30d supply, fill #1
  Filled 2022-03-29: qty 60, 30d supply, fill #2
  Filled 2022-05-01: qty 60, 30d supply, fill #3

## 2022-01-30 MED ORDER — HYDRALAZINE HCL 100 MG PO TABS
100.0000 mg | ORAL_TABLET | Freq: Three times a day (TID) | ORAL | 1 refills | Status: DC
  Filled 2022-01-30: qty 90, 30d supply, fill #0

## 2022-01-30 NOTE — Telephone Encounter (Signed)
Requested medication (s) are due for refill today - yes  Requested medication (s) are on the active medication list -yes  Future visit scheduled -yes  Last refill: 12/30/21 #30  Notes to clinic: outside provider  Requested Prescriptions  Pending Prescriptions Disp Refills   spironolactone (ALDACTONE) 50 MG tablet 30 tablet 0    Sig: Take 1 tablet (50 mg total) by mouth every morning.     Cardiovascular: Diuretics - Aldosterone Antagonist Failed - 01/30/2022 10:23 AM      Failed - Cr in normal range and within 180 days    Creatinine, Ser  Date Value Ref Range Status  12/30/2021 1.42 (H) 0.44 - 1.00 mg/dL Final         Failed - Last BP in normal range    BP Readings from Last 1 Encounters:  01/16/22 (!) 140/95         Passed - K in normal range and within 180 days    Potassium  Date Value Ref Range Status  12/30/2021 3.9 3.5 - 5.1 mmol/L Final         Passed - Na in normal range and within 180 days    Sodium  Date Value Ref Range Status  12/30/2021 139 135 - 145 mmol/L Final  04/06/2021 139 134 - 144 mmol/L Final         Passed - eGFR is 30 or above and within 180 days    GFR, Estimated  Date Value Ref Range Status  12/30/2021 46 (L) >60 mL/min Final    Comment:    (NOTE) Calculated using the CKD-EPI Creatinine Equation (2021)    eGFR  Date Value Ref Range Status  04/06/2021 64 >59 mL/min/1.73 Final         Passed - Valid encounter within last 6 months    Recent Outpatient Visits           2 weeks ago Hospital discharge follow-up   [place] Ladell Pier, MD   9 months ago Encounter to establish care   [place], MD       Future Appointments             In 1 month Ladell Pier, MD [place]             furosemide (LASIX) 40 MG tablet 30 tablet 0    Sig: Take 1 tablet (40 mg total) by mouth daily.     Cardiovascular:   Diuretics - Loop Failed - 01/30/2022 10:23 AM      Failed - Ca in normal range and within 180 days    Calcium  Date Value Ref Range Status  12/30/2021 8.7 (L) 8.9 - 10.3 mg/dL Final         Failed - Cr in normal range and within 180 days    Creatinine, Ser  Date Value Ref Range Status  12/30/2021 1.42 (H) 0.44 - 1.00 mg/dL Final         Failed - Last BP in normal range    BP Readings from Last 1 Encounters:  01/16/22 (!) 140/95         Passed - K in normal range and within 180 days    Potassium  Date Value Ref Range Status  12/30/2021 3.9 3.5 - 5.1 mmol/L Final         Passed - Na in normal range and within 180 days  Sodium  Date Value Ref Range Status  12/30/2021 139 135 - 145 mmol/L Final  04/06/2021 139 134 - 144 mmol/L Final         Passed - Cl in normal range and within 180 days    Chloride  Date Value Ref Range Status  12/30/2021 103 98 - 111 mmol/L Final         Passed - Mg Level in normal range and within 180 days    Magnesium  Date Value Ref Range Status  12/29/2021 2.2 1.7 - 2.4 mg/dL Final    Comment:    Performed at [facility], [place] [address], [place], [place] [address]         Passed - Valid encounter within last 6 months    Recent Outpatient Visits           2 weeks ago Hospital discharge follow-up   [place] Ladell Pier, MD   9 months ago Encounter to establish care   [place], MD       Future Appointments             In 1 month Ladell Pier, MD [place]               Requested Prescriptions  Pending Prescriptions Disp Refills   spironolactone (ALDACTONE) 50 MG tablet 30 tablet 0    Sig: Take 1 tablet (50 mg total) by mouth every morning.     Cardiovascular: Diuretics - Aldosterone Antagonist Failed - 01/30/2022 10:23 AM      Failed - Cr in normal range and within 180 days     Creatinine, Ser  Date Value Ref Range Status  12/30/2021 1.42 (H) 0.44 - 1.00 mg/dL Final         Failed - Last BP in normal range    BP Readings from Last 1 Encounters:  01/16/22 (!) 140/95         Passed - K in normal range and within 180 days    Potassium  Date Value Ref Range Status  12/30/2021 3.9 3.5 - 5.1 mmol/L Final         Passed - Na in normal range and within 180 days    Sodium  Date Value Ref Range Status  12/30/2021 139 135 - 145 mmol/L Final  04/06/2021 139 134 - 144 mmol/L Final         Passed - eGFR is 30 or above and within 180 days    GFR, Estimated  Date Value Ref Range Status  12/30/2021 46 (L) >60 mL/min Final    Comment:    (NOTE) Calculated using the CKD-EPI Creatinine Equation (2021)    eGFR  Date Value Ref Range Status  04/06/2021 64 >59 mL/min/1.73 Final         Passed - Valid encounter within last 6 months    Recent Outpatient Visits           2 weeks ago Hospital discharge follow-up   [place], Deborah B, MD   9 months ago Encounter to establish care   [place], MD       Future Appointments             In 1 month Wynetta Emery Dalbert Batman, MD [place]  furosemide (LASIX) 40 MG tablet 30 tablet 0    Sig: Take 1 tablet (40 mg total) by mouth daily.     Cardiovascular:  Diuretics - Loop Failed - 01/30/2022 10:23 AM      Failed - Ca in normal range and within 180 days    Calcium  Date Value Ref Range Status  12/30/2021 8.7 (L) 8.9 - 10.3 mg/dL Final         Failed - Cr in normal range and within 180 days    Creatinine, Ser  Date Value Ref Range Status  12/30/2021 1.42 (H) 0.44 - 1.00 mg/dL Final         Failed - Last BP in normal range    BP Readings from Last 1 Encounters:  01/16/22 (!) 140/95         Passed - K in normal range and within 180 days    Potassium  Date Value Ref Range  Status  12/30/2021 3.9 3.5 - 5.1 mmol/L Final         Passed - Na in normal range and within 180 days    Sodium  Date Value Ref Range Status  12/30/2021 139 135 - 145 mmol/L Final  04/06/2021 139 134 - 144 mmol/L Final         Passed - Cl in normal range and within 180 days    Chloride  Date Value Ref Range Status  12/30/2021 103 98 - 111 mmol/L Final         Passed - Mg Level in normal range and within 180 days    Magnesium  Date Value Ref Range Status  12/29/2021 2.2 1.7 - 2.4 mg/dL Final    Comment:    Performed at [facility], [place] [address], [place], [place] [address]         Passed - Valid encounter within last 6 months    Recent Outpatient Visits           2 weeks ago Hospital discharge follow-up   [place], MD   9 months ago Encounter to establish care   [place], MD       Future Appointments             In 1 month Wynetta Emery, Dalbert Batman, MD [place]

## 2022-01-31 ENCOUNTER — Other Ambulatory Visit: Payer: Self-pay

## 2022-01-31 MED ORDER — SPIRONOLACTONE 50 MG PO TABS
50.0000 mg | ORAL_TABLET | Freq: Every morning | ORAL | 2 refills | Status: DC
Start: 2022-01-31 — End: 2022-05-01
  Filled 2022-01-31: qty 30, 30d supply, fill #0
  Filled 2022-02-28: qty 30, 30d supply, fill #1
  Filled 2022-03-29: qty 30, 30d supply, fill #2

## 2022-01-31 MED ORDER — FUROSEMIDE 40 MG PO TABS
40.0000 mg | ORAL_TABLET | Freq: Every day | ORAL | 2 refills | Status: DC
Start: 2022-01-31 — End: 2022-05-01
  Filled 2022-01-31: qty 30, 30d supply, fill #0
  Filled 2022-02-28: qty 30, 30d supply, fill #1
  Filled 2022-03-29: qty 30, 30d supply, fill #2

## 2022-02-01 ENCOUNTER — Other Ambulatory Visit: Payer: Self-pay

## 2022-02-28 ENCOUNTER — Other Ambulatory Visit: Payer: Self-pay

## 2022-02-28 ENCOUNTER — Other Ambulatory Visit: Payer: Self-pay | Admitting: Internal Medicine

## 2022-03-01 MED ORDER — ALBUTEROL SULFATE HFA 108 (90 BASE) MCG/ACT IN AERS
2.0000 | INHALATION_SPRAY | RESPIRATORY_TRACT | 6 refills | Status: DC
Start: 2022-03-01 — End: 2022-06-25
  Filled 2022-03-01: qty 8.5, 25d supply, fill #0
  Filled 2022-05-30: qty 8.5, 25d supply, fill #1
  Filled 2022-06-04: qty 6.7, 20d supply, fill #1
  Filled 2022-06-25: qty 6.7, 20d supply, fill #2

## 2022-03-02 ENCOUNTER — Other Ambulatory Visit: Payer: Self-pay

## 2022-03-02 ENCOUNTER — Ambulatory Visit: Payer: Self-pay | Admitting: Internal Medicine

## 2022-03-29 ENCOUNTER — Other Ambulatory Visit: Payer: Self-pay

## 2022-03-30 ENCOUNTER — Other Ambulatory Visit: Payer: Self-pay

## 2022-04-02 ENCOUNTER — Other Ambulatory Visit: Payer: Self-pay

## 2022-05-01 ENCOUNTER — Other Ambulatory Visit: Payer: Self-pay

## 2022-05-01 ENCOUNTER — Other Ambulatory Visit: Payer: Self-pay | Admitting: Internal Medicine

## 2022-05-01 MED ORDER — FUROSEMIDE 40 MG PO TABS
40.0000 mg | ORAL_TABLET | Freq: Every day | ORAL | 0 refills | Status: DC
  Filled 2022-05-01: qty 30, 30d supply, fill #0

## 2022-05-01 MED ORDER — SPIRONOLACTONE 50 MG PO TABS
50.0000 mg | ORAL_TABLET | Freq: Every morning | ORAL | 0 refills | Status: DC
  Filled 2022-05-01: qty 30, 30d supply, fill #0

## 2022-05-02 ENCOUNTER — Other Ambulatory Visit: Payer: Self-pay

## 2022-05-02 ENCOUNTER — Other Ambulatory Visit: Payer: Self-pay | Admitting: Internal Medicine

## 2022-05-02 NOTE — Telephone Encounter (Signed)
Medication Refill - Medication: atorvastatin (LIPITOR) 40 MG tablet  Pt stated medication was prescribed while she was at the hospital and has been out of medication for about two months.   Has the patient contacted their pharmacy? Yes.    (Agent: If yes, when and what did the pharmacy advise?)  Preferred Pharmacy (with phone number or street name):  [facility] [address], [place] [address]  Phone: [phone] Fax: [phone]  Hours: M-F 7:30a-6:00p   Has the patient been seen for an appointment in the last year OR does the patient have an upcoming appointment? Yes.    Agent: Please be advised that RX refills may take up to 3 business days. We ask that you follow-up with your pharmacy.

## 2022-05-03 ENCOUNTER — Other Ambulatory Visit: Payer: Self-pay

## 2022-05-03 MED ORDER — ATORVASTATIN CALCIUM 40 MG PO TABS
40.0000 mg | ORAL_TABLET | Freq: Every day | ORAL | 0 refills | Status: DC
  Filled 2022-05-03: qty 30, 30d supply, fill #0

## 2022-05-03 NOTE — Telephone Encounter (Signed)
Requested medication (s) are due for refill today: yes  Requested medication (s) are on the active medication list: yes  Last refill:  01/02/22  Future visit scheduled: yes  Notes to clinic:  Unable to refill per protocol, last refill by another provider 01/02/22, routing for approval.     Requested Prescriptions  Pending Prescriptions Disp Refills   atorvastatin (LIPITOR) 40 MG tablet 90 tablet 0    Sig: Take 1 tablet (40 mg total) by mouth at bedtime.     Cardiovascular:  Antilipid - Statins Failed - 05/02/2022 11:18 AM      Failed - Lipid Panel in normal range within the last 12 months    Cholesterol  Date Value Ref Range Status  08/18/2020 216 (H) 0 - 200 mg/dL Final   LDL Cholesterol  Date Value Ref Range Status  08/18/2020 136 (H) 0 - 99 mg/dL Final    Comment:           Total Cholesterol/HDL:CHD Risk Coronary Heart Disease Risk Table                     Men   Women  1/2 Average Risk   3.4   3.3  Average Risk       5.0   4.4  2 X Average Risk   9.6   7.1  3 X Average Risk  23.4   11.0        Use the calculated Patient Ratio above and the CHD Risk Table to determine the patient's CHD Risk.        ATP III CLASSIFICATION (LDL):  <100     mg/dL   Optimal  100-129  mg/dL   Near or Above                    Optimal  130-159  mg/dL   Borderline  160-189  mg/dL   High  >190     mg/dL   Very High Performed at [facility] [address], [place], [place] [address]    HDL  Date Value Ref Range Status  08/18/2020 46 >40 mg/dL Final   Triglycerides  Date Value Ref Range Status  08/18/2020 168 (H) <150 mg/dL Final         Passed - Patient is not pregnant      Passed - Valid encounter within last 12 months    Recent Outpatient Visits           3 months ago Hospital discharge follow-up   [place] Ladell Pier, MD   1 year ago Encounter to establish care   [place], MD       Future Appointments             In 3 weeks Ladell Pier, MD [place]

## 2022-05-10 ENCOUNTER — Other Ambulatory Visit: Payer: Self-pay

## 2022-05-29 ENCOUNTER — Encounter: Payer: Self-pay | Admitting: Internal Medicine

## 2022-05-29 ENCOUNTER — Ambulatory Visit: Payer: Self-pay | Attending: Internal Medicine | Admitting: Internal Medicine

## 2022-05-29 ENCOUNTER — Other Ambulatory Visit (HOSPITAL_COMMUNITY)
Admission: RE | Admit: 2022-05-29 | Discharge: 2022-05-29 | Disposition: A | Discharge status: Home / Self Care | Payer: Self-pay | Source: Ambulatory Visit | Attending: Internal Medicine | Admitting: Internal Medicine

## 2022-05-29 VITALS — BP 134/94 | HR 99 | Ht 62.0 in | Wt 264.4 lb

## 2022-05-29 DIAGNOSIS — Z124 Encounter for screening for malignant neoplasm of cervix: Secondary | ICD-10-CM | POA: Insufficient documentation

## 2022-05-29 DIAGNOSIS — I10 Essential (primary) hypertension: Secondary | ICD-10-CM

## 2022-05-29 DIAGNOSIS — I5042 Chronic combined systolic (congestive) and diastolic (congestive) heart failure: Secondary | ICD-10-CM

## 2022-05-29 DIAGNOSIS — R739 Hyperglycemia, unspecified: Secondary | ICD-10-CM

## 2022-05-29 DIAGNOSIS — N1832 Chronic kidney disease, stage 3b: Secondary | ICD-10-CM

## 2022-05-29 DIAGNOSIS — D649 Anemia, unspecified: Secondary | ICD-10-CM

## 2022-05-29 MED ORDER — LOSARTAN POTASSIUM 25 MG PO TABS
25.0000 mg | ORAL_TABLET | Freq: Every day | ORAL | 6 refills | Status: DC
  Filled 2022-05-29: qty 30, 30d supply, fill #0
  Filled 2022-06-25: qty 30, 30d supply, fill #1
  Filled 2022-07-25: qty 30, 30d supply, fill #2
  Filled 2022-08-28 (×2): qty 30, 30d supply, fill #3
  Filled 2022-09-26 (×2): qty 30, 30d supply, fill #4
  Filled 2022-10-26: qty 30, 30d supply, fill #5
  Filled 2022-11-28 (×2): qty 30, 30d supply, fill #6

## 2022-05-29 MED ORDER — ATORVASTATIN CALCIUM 40 MG PO TABS
40.0000 mg | ORAL_TABLET | Freq: Every day | ORAL | 6 refills | Status: DC
  Filled 2022-05-29: qty 30, 30d supply, fill #0

## 2022-05-29 MED ORDER — AMLODIPINE BESYLATE 10 MG PO TABS
10.0000 mg | ORAL_TABLET | Freq: Every day | ORAL | 6 refills | Status: DC
  Filled 2022-05-29: qty 30, 30d supply, fill #0
  Filled 2022-06-25: qty 30, 30d supply, fill #1
  Filled 2022-07-25: qty 30, 30d supply, fill #2
  Filled 2022-08-28 (×2): qty 30, 30d supply, fill #3
  Filled 2022-09-26 (×2): qty 30, 30d supply, fill #4
  Filled 2022-10-26: qty 30, 30d supply, fill #5
  Filled 2022-11-28 (×2): qty 30, 30d supply, fill #6

## 2022-05-29 MED ORDER — HYDRALAZINE HCL 100 MG PO TABS
50.0000 mg | ORAL_TABLET | Freq: Three times a day (TID) | ORAL | 6 refills | Status: AC
  Filled 2022-05-29: qty 45, 30d supply, fill #0

## 2022-05-29 MED ORDER — CARVEDILOL 25 MG PO TABS
25.0000 mg | ORAL_TABLET | Freq: Two times a day (BID) | ORAL | 1 refills | Status: DC
  Filled 2022-05-29: qty 60, 30d supply, fill #0
  Filled 2022-06-25: qty 60, 30d supply, fill #1
  Filled 2022-07-25: qty 60, 30d supply, fill #2
  Filled 2022-08-28 (×2): qty 60, 30d supply, fill #3
  Filled 2022-09-26 (×2): qty 60, 30d supply, fill #4
  Filled 2022-10-26: qty 60, 30d supply, fill #5

## 2022-05-29 MED ORDER — LOSARTAN POTASSIUM 50 MG PO TABS
50.0000 mg | ORAL_TABLET | Freq: Every evening | ORAL | 1 refills | Status: DC
  Filled 2022-05-29: qty 30, 30d supply, fill #0
  Filled 2022-06-25: qty 30, 30d supply, fill #1
  Filled 2022-07-25: qty 30, 30d supply, fill #2
  Filled 2022-08-28 (×2): qty 30, 30d supply, fill #3
  Filled 2022-09-26 (×2): qty 30, 30d supply, fill #4
  Filled 2022-10-26: qty 30, 30d supply, fill #5

## 2022-05-29 MED ORDER — SPIRONOLACTONE 50 MG PO TABS
50.0000 mg | ORAL_TABLET | Freq: Every morning | ORAL | 1 refills | Status: DC
  Filled 2022-05-29: qty 30, 30d supply, fill #0

## 2022-05-29 MED ORDER — FUROSEMIDE 40 MG PO TABS
ORAL_TABLET | ORAL | 1 refills | Status: DC
  Filled 2022-05-29: qty 12, 30d supply, fill #0

## 2022-05-29 NOTE — Progress Notes (Addendum)
Patient ID: Rebecca Donovan, female    DOB: [birth year]  MRN: [id number]  CC: Hypertension   Subjective: Rebecca Donovan is a 46 y.o. 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 Barnett Applebaum CV),  CKD 3b, HL(decline statin), migraines, asthma, MDD/anxiety, Anemia  GYN History:  Pt is G2P1 (1 miscarriage) Any hx of abn paps?: yes.  Had part of cervix removed 21 yrs ago Menses regular or irregular?: last menses was July or August; regular up to that point.  Not sexually active How long does menses last? 3-4 days Menstrual flow light or heavy?: light for past 6 mths Method of birth control?:  not active Any vaginal dischg at this time?:  no Dysuria?: no Any hx of STI?: no Sexually active with how many partners:  Desires STI screen: yes Last MMG: never had one. Checks breast 2 x a wk.  Pt wants to wait on doing MMG.  Family hx of uterine, cervical or breast cancer?:  no  HTN: taking meds  and took today.  Takes Losartan 75 mg at nights.  Takes Hydralazine 100 mg 1/2 tab TID (we had decreased the dose from 100 mg 3 times daily on last visit because the higher dose was causing nausea and making her feel bad).  Reports compliance with amlodipine 10 mg daily, carvedilol 25 mg twice a day spironolactone 50 mg daily and Lasix 40 mg daily.  She feels the Lasix has caused loss of taste and smell for her.  She wonders whether she needs to be on this medicine lifelong. Denies any lower extremity edema.  She limits salt in the foods.  She checks blood pressure at home regularly.  Her range has been 120s/90s.  She has not seen cardiology in a while due to lack of insurance. It has been several months since we have rechecked her GFR and creatinine.  She is willing to have labs done on that.  They also note that she had a mild anemia when she left the hospital in  May of this year.  Patient Active Problem List   Diagnosis Date Noted   Stage 3b chronic kidney disease (CKD) ([place]) 12/30/2021   Acute respiratory distress 12/27/2021   Essential hypertension 04/06/2021   Acute on chronic combined systolic and diastolic CHF (congestive heart failure) ([place]) 04/06/2021   Insomnia due to other mental disorder 04/06/2021   Major depressive disorder, single episode, moderate ([place]) 04/06/2021   GAD (generalized anxiety disorder) 04/06/2021   Cardiomyopathy ([place])    Hypertensive emergency    SOB (shortness of breath)    Elevated troponin level not due to acute coronary syndrome    Community acquired pneumonia    Pericardial effusion    Morbid obesity ([place])    Elevated brain natriuretic peptide (BNP) level    Hypertensive urgency 08/16/2020     Current Outpatient Medications on File Prior to Visit  Medication Sig Dispense Refill   acetaminophen (TYLENOL) 500 MG tablet Take 1,000 mg by mouth daily as needed (pain).     albuterol (VENTOLIN HFA) 108 (90 Base) MCG/ACT inhaler Inhale 2 puffs six times a day as needed for shortness of breath 8.5 g 6   Ascorbic Acid (VITAMIN C PO) Take 1,500 mg by mouth daily as needed (when feeling sick).     aspirin-acetaminophen-caffeine (EXCEDRIN MIGRAINE) 250-250-65 MG tablet Take 2 tablets by mouth  daily as needed (severe headache).     atorvastatin (LIPITOR) 40 MG tablet Take 1 tablet (40 mg total) by mouth at bedtime. 30 tablet 0   carvedilol (COREG) 25 MG tablet Take 1 tablet (25 mg total) by mouth 2 (two) times daily with a meal. 180 tablet 1   Coenzyme Q10 (CO Q 10 PO) Take 2 tablets by mouth daily.     fluticasone (FLONASE) 50 MCG/ACT nasal spray Place 1 spray into both nostrils daily. 16 g 2   furosemide (LASIX) 40 MG tablet Take 1 tablet (40 mg total) by mouth daily. 30 tablet 0   Homeopathic Products ([facility]) CREA Apply 1 application topically daily as needed (bruises).     hydrALAZINE (APRESOLINE) 100 MG  tablet Take 0.5 tablets (50 mg total) by mouth every 8 (eight) hours. 45 tablet 6   losartan (COZAAR) 25 MG tablet Take 1 tablet (25 mg total) by mouth daily. Take 1 tab daily with the 50 mg tab for a total of 75 mg daily total. 30 tablet 5   losartan (COZAAR) 50 MG tablet Take 1 tablet (50 mg total) by mouth every evening. 90 tablet 1   OVER THE COUNTER MEDICATION Take 1 tablet by mouth daily. Multivitamin     spironolactone (ALDACTONE) 50 MG tablet Take 1 tablet (50 mg total) by mouth every morning. 30 tablet 0   amLODipine (NORVASC) 10 MG tablet Take 1 tablet (10 mg total) by mouth daily. 30 tablet 0   hydrALAZINE (APRESOLINE) 100 MG tablet Take 1 tablet (100 mg total) by mouth every 8 (eight) hours. 270 tablet 1   sertraline (ZOLOFT) 50 MG tablet 1/2 tab PO daily x4 wks then 1 tab daily 30 tablet 1   No current facility-administered medications on file prior to visit.    No Known Allergies  Social History   Socioeconomic History   Marital status: Divorced    Spouse name: Not on file   Number of children: Not on file   Years of education: Not on file   Highest education level: Not on file  Occupational History   Not on file  Tobacco Use   Smoking status: Never   Smokeless tobacco: Never  Vaping Use   Vaping Use: Never used  Substance and Sexual Activity   Alcohol use: Never   Drug use: Never   Sexual activity: Not on file  Other Topics Concern   Not on file  Social History Narrative   Not on file   Social Determinants of Health   Financial Resource Strain: Not on file  Food Insecurity: Not on file  Transportation Needs: Not on file  Physical Activity: Not on file  Stress: Not on file  Social Connections: Not on file  Intimate Partner Violence: Not on file    Family History  Problem Relation Age of Onset   Hypertension Mother    Diabetes Maternal Grandfather    Cancer Paternal Grandfather     Past Surgical History:  Procedure Laterality Date   CERVIX  SURGERY      ROS: Review of Systems Negative except as stated above  PHYSICAL EXAM: BP (!) 145/95   Pulse 99   Ht '5\' 2"'$  (1.575 m)   Wt 264 lb 6.4 oz (119.9 kg)   SpO2 99%   BMI 48.36 kg/m   Physical Exam Repeat BP 134/94 General appearance - alert, well appearing, obese middle-age Caucasian female and in no distress Mental status - normal mood, behavior, speech, dress, motor  activity, and thought processes Chest - clear to auscultation, no wheezes, rales or rhonchi, symmetric air entry Heart - normal rate, regular rhythm, normal S1, S2, no murmurs, rubs, clicks or gallops Breasts: Patient declined breast exam today. Pelvic - CMA DeEtta Riki Sheer present:  normal external genitalia, vulva, vagina, cervix, uterus and adnexa Extremities -no lower extremity edema.      Latest Ref Rng & Units 12/30/2021    2:51 AM 12/29/2021    2:15 AM 12/28/2021    2:40 AM  CMP  Glucose 70 - 99 mg/dL 101  118  119   BUN 6 - 20 mg/dL '23  24  18   '$ Creatinine 0.44 - 1.00 mg/dL 1.42  1.36  1.16   Sodium 135 - 145 mmol/L 139  137  138   Potassium 3.5 - 5.1 mmol/L 3.9  3.6  4.2   Chloride 98 - 111 mmol/L 103  105  108   CO2 22 - 32 mmol/L '26  25  23   '$ Calcium 8.9 - 10.3 mg/dL 8.7  8.5  8.6    Lipid Panel     Component Value Date/Time   CHOL 216 (H) 08/18/2020 0421   TRIG 168 (H) 08/18/2020 0421   HDL 46 08/18/2020 0421   CHOLHDL 4.7 08/18/2020 0421   VLDL 34 08/18/2020 0421   LDLCALC 136 (H) 08/18/2020 0421    CBC    Component Value Date/Time   WBC 6.4 12/29/2021 0215   RBC 4.05 12/29/2021 0215   HGB 10.9 (L) 12/29/2021 0215   HCT 34.1 (L) 12/29/2021 0215   PLT 208 12/29/2021 0215   MCV 84.2 12/29/2021 0215   MCH 26.9 12/29/2021 0215   MCHC 32.0 12/29/2021 0215   RDW 16.5 (H) 12/29/2021 0215   LYMPHSABS 1.5 12/27/2021 0903   MONOABS 0.7 12/27/2021 0903   EOSABS 0.3 12/27/2021 0903   BASOSABS 0.1 12/27/2021 0903    ASSESSMENT AND PLAN:  1. Pap smear for cervical cancer  screening - Cytology - PAP - Cervicovaginal ancillary only  2. Essential hypertension Not at goal even though reported home blood pressure readings are better. I did not see loss of taste/smell as a S.E of  furosemide this is a side effect of the medication on UpToDate but I will check with our clinical pharmacist as well. Addendum: heard back from clinical pharmacist after she left.  He did not see anything on the drug packaging either that listed this as a S.E of med. However, she is compensated in regards to her heart failure.  I told her that we can try having her decrease the furosemide to 2-3 times a week.  However we would have to increase the dose of one of the other medications like hydralazine to 75 mg 3 times a day.  Patient did not want to increase the dose of hydralazine stating that she did not tolerate higher doses.  I will have her follow-up with clinical pharmacist in a few weeks for repeat blood pressure check. - amLODipine (NORVASC) 10 MG tablet; Take 1 tablet (10 mg total) by mouth daily.  Dispense: 30 tablet; Refill: 6 - carvedilol (COREG) 25 MG tablet; Take 1 tablet (25 mg total) by mouth 2 (two) times daily with a meal.  Dispense: 180 tablet; Refill: 1 - losartan (COZAAR) 25 MG tablet; Take 1 tablet (25 mg total) by mouth daily. Take 1 tab daily with the 50 mg tab for a total of 75 mg daily total.  Dispense: 30 tablet; Refill:  6 - furosemide (LASIX) 40 MG tablet; Take 1 tablet by mouth 3 times a week  Dispense: 30 tablet; Refill: 1 - hydrALAZINE (APRESOLINE) 100 MG tablet; Take 0.5 tablets (50 mg total) by mouth every 8 (eight) hours.  Dispense: 45 tablet; Refill: 6 - losartan (COZAAR) 50 MG tablet; Take 1 tablet (50 mg total) by mouth every evening.  Dispense: 90 tablet; Refill: 1 - spironolactone (ALDACTONE) 50 MG tablet; Take 1 tablet (50 mg total) by mouth every morning.  Dispense: 90 tablet; Refill: 1  3. Stage 3b chronic kidney disease ([place]) Stressed importance of good  blood pressure control. - Basic metabolic panel; Future  4. Normocytic anemia Recheck CBC. - CBC; Future   Addendum 06/02/2022:  pt with hyperglycemia on lab test.  Will add A1C. Patient was given the opportunity to ask questions.  Patient verbalized understanding of the plan and was able to repeat key elements of the plan.   This documentation was completed using Radio producer.  Any transcriptional errors are unintentional.  No orders of the defined types were placed in this encounter.    Requested Prescriptions    No prescriptions requested or ordered in this encounter    No follow-ups on file.  Karle Plumber, MD, FACP

## 2022-05-29 NOTE — Patient Instructions (Signed)
Cut back on furosemide to 2-3 times a week. Continue to monitor your blood pressure regularly.  Bring in your log book with you when you come to see the clinical pharmacist in 2 weeks. Please come to the lab sometime this week to have your blood test done.

## 2022-05-30 ENCOUNTER — Other Ambulatory Visit: Payer: Self-pay

## 2022-05-31 ENCOUNTER — Ambulatory Visit: Payer: Self-pay | Attending: Internal Medicine

## 2022-05-31 DIAGNOSIS — N1832 Chronic kidney disease, stage 3b: Secondary | ICD-10-CM

## 2022-05-31 DIAGNOSIS — D649 Anemia, unspecified: Secondary | ICD-10-CM

## 2022-05-31 LAB — CERVICOVAGINAL ANCILLARY ONLY
Bacterial Vaginitis (gardnerella): NEGATIVE
Candida Glabrata: NEGATIVE
Candida Vaginitis: NEGATIVE
Chlamydia: NEGATIVE
Comment: NEGATIVE
Comment: NEGATIVE
Comment: NEGATIVE
Comment: NEGATIVE
Comment: NEGATIVE
Comment: NORMAL
Neisseria Gonorrhea: NEGATIVE
Trichomonas: NEGATIVE

## 2022-05-31 LAB — CYTOLOGY - PAP
Comment: NEGATIVE
Diagnosis: NEGATIVE
High risk HPV: NEGATIVE

## 2022-06-01 LAB — CBC
Hematocrit: 40.4 % (ref 34.0–46.6)
Hemoglobin: 13.2 g/dL (ref 11.1–15.9)
MCH: 29.1 pg (ref 26.6–33.0)
MCHC: 32.7 g/dL (ref 31.5–35.7)
MCV: 89 fL (ref 79–97)
Platelets: 225 10*3/uL (ref 150–450)
RBC: 4.54 x10E6/uL (ref 3.77–5.28)
RDW: 14.2 % (ref 11.7–15.4)
WBC: 8.8 10*3/uL (ref 3.4–10.8)

## 2022-06-01 LAB — BASIC METABOLIC PANEL
BUN/Creatinine Ratio: 7 — ABNORMAL LOW (ref 9–23)
BUN: 10 mg/dL (ref 6–24)
CO2: 23 mmol/L (ref 20–29)
Calcium: 9 mg/dL (ref 8.7–10.2)
Chloride: 103 mmol/L (ref 96–106)
Creatinine, Ser: 1.47 mg/dL — ABNORMAL HIGH (ref 0.57–1.00)
Glucose: 133 mg/dL — ABNORMAL HIGH (ref 70–99)
Potassium: 5.4 mmol/L — ABNORMAL HIGH (ref 3.5–5.2)
Sodium: 144 mmol/L (ref 134–144)
eGFR: 45 mL/min/{1.73_m2} — ABNORMAL LOW (ref >59)

## 2022-06-02 ENCOUNTER — Telehealth: Payer: Self-pay | Admitting: Internal Medicine

## 2022-06-02 ENCOUNTER — Encounter: Payer: Self-pay | Admitting: Internal Medicine

## 2022-06-02 DIAGNOSIS — I10 Essential (primary) hypertension: Secondary | ICD-10-CM

## 2022-06-02 DIAGNOSIS — N1832 Chronic kidney disease, stage 3b: Secondary | ICD-10-CM

## 2022-06-02 MED ORDER — SPIRONOLACTONE 25 MG PO TABS
25.0000 mg | ORAL_TABLET | Freq: Every morning | ORAL | 5 refills | Status: DC
  Filled 2022-06-02: qty 30, 30d supply, fill #0
  Filled 2022-07-04: qty 11, 11d supply, fill #1
  Filled 2022-07-04: qty 19, 19d supply, fill #1
  Filled 2022-07-30: qty 30, 30d supply, fill #2
  Filled 2022-08-28 (×2): qty 30, 30d supply, fill #3
  Filled 2022-09-26 (×2): qty 30, 30d supply, fill #4
  Filled 2022-10-26: qty 30, 30d supply, fill #5

## 2022-06-02 MED ORDER — FUROSEMIDE 40 MG PO TABS
40.0000 mg | ORAL_TABLET | Freq: Every day | ORAL | 1 refills | Status: DC
  Filled 2022-06-02 – 2022-06-07 (×3): qty 90, 90d supply, fill #0
  Filled 2022-08-28: qty 90, 90d supply, fill #1
  Filled 2022-08-28: qty 30, 30d supply, fill #1
  Filled 2022-09-26 (×2): qty 30, 30d supply, fill #2
  Filled 2022-11-03: qty 30, 30d supply, fill #3

## 2022-06-02 MED ORDER — ISOSORBIDE MONONITRATE ER 30 MG PO TB24
30.0000 mg | ORAL_TABLET | Freq: Every day | ORAL | 4 refills | Status: DC
  Filled 2022-06-02: qty 30, 30d supply, fill #0
  Filled 2022-07-04: qty 30, 30d supply, fill #1

## 2022-06-02 NOTE — Addendum Note (Signed)
Addended by: Karle Plumber B on: 06/02/2022 09:31 AM   Modules accepted: Orders

## 2022-06-02 NOTE — Telephone Encounter (Addendum)
Phone call placed to patient this morning to go over the results of recent labs.  I was forwarded to her voicemail.  I left a message informing her of who I am and that I was calling to go over lab results.  I told her that I will send a message to her MyChart account and I would like for her to take a look at it.  Addendum: Patient called me back several minutes later through the on-call nurse.  I saw that she had already reviewed my comments that I sent to her MyChart account regarding her labs.  I inquired whether she had any questions in regards to my comments.  I told her that her potassium level was elevated and most likely due to spironolactone.  I recommended decreasing the dose of spironolactone from 50 mg daily to 25 mg daily.  After she has been on the lower dose for 1 week, I want her to return to the lab to have potassium and kidney function rechecked. Function not 100% but somewhat stable compared to in May of this year.  This is likely due to an interplay between her heart and kidneys with her having CHF.  Advised to avoid all NSAIDs.  We will continue to monitor kidney function. Since we are decreasing the dose of spironolactone and the frequency of furosemide (this was discussed on recent visit), I recommend that we add another medication to get her blood pressure better call Imdur.  Patient tells me that she will decrease the dose of the spironolactone and will come to the lab after she has been on the lower dose for 1 week. She also reports that she feels bloated since decreasing the frequency of furosemide.  She wants to go back to taking it every day.  I told her I will send an updated prescription to the pharmacy for it.  I also told her that I looked up furosemide to see whether it causes loss of taste or smell and could not find that in the literature.  I also had our clinical pharmacist look it up and he did not see this as a side effect of furosemide either.  Patient then stated that  she gets that she cannot trust everything she read on [employer].  She is open to me adding the Imdur.  Advised that I will send a prescription to the pharmacy.  Also advised that she continues to monitor blood pressure.  All questions were answered.

## 2022-06-02 NOTE — Addendum Note (Signed)
Addended by: Karle Plumber B on: 06/02/2022 09:34 AM   Modules accepted: Orders

## 2022-06-02 NOTE — Addendum Note (Signed)
Addended by: Karle Plumber B on: 06/02/2022 09:47 AM   Modules accepted: Orders

## 2022-06-04 ENCOUNTER — Other Ambulatory Visit: Payer: Self-pay

## 2022-06-06 ENCOUNTER — Encounter: Payer: Self-pay | Admitting: Internal Medicine

## 2022-06-06 DIAGNOSIS — R7303 Prediabetes: Secondary | ICD-10-CM | POA: Insufficient documentation

## 2022-06-06 LAB — SPECIMEN STATUS REPORT

## 2022-06-06 LAB — HEMOGLOBIN A1C
Est. average glucose Bld gHb Est-mCnc: 126 mg/dL
Hgb A1c MFr Bld: 6 % — ABNORMAL HIGH (ref 4.8–5.6)

## 2022-06-07 ENCOUNTER — Other Ambulatory Visit: Payer: Self-pay

## 2022-06-11 ENCOUNTER — Other Ambulatory Visit: Payer: Self-pay

## 2022-06-25 ENCOUNTER — Other Ambulatory Visit: Payer: Self-pay | Admitting: Pharmacist

## 2022-06-25 ENCOUNTER — Other Ambulatory Visit: Payer: Self-pay

## 2022-06-25 IMAGING — CR DG CHEST 2V
2 series · 2 of 2 positions shown · non-contrast
Comparison: Radiographs 10/12/2020. Abdominopelvic CT 12/27/2021
and chest CT 08/16/2020.

CLINICAL DATA: Right lower lesion, possible pneumonia.

EXAM:
CHEST - 2 VIEW

[w chest pa]
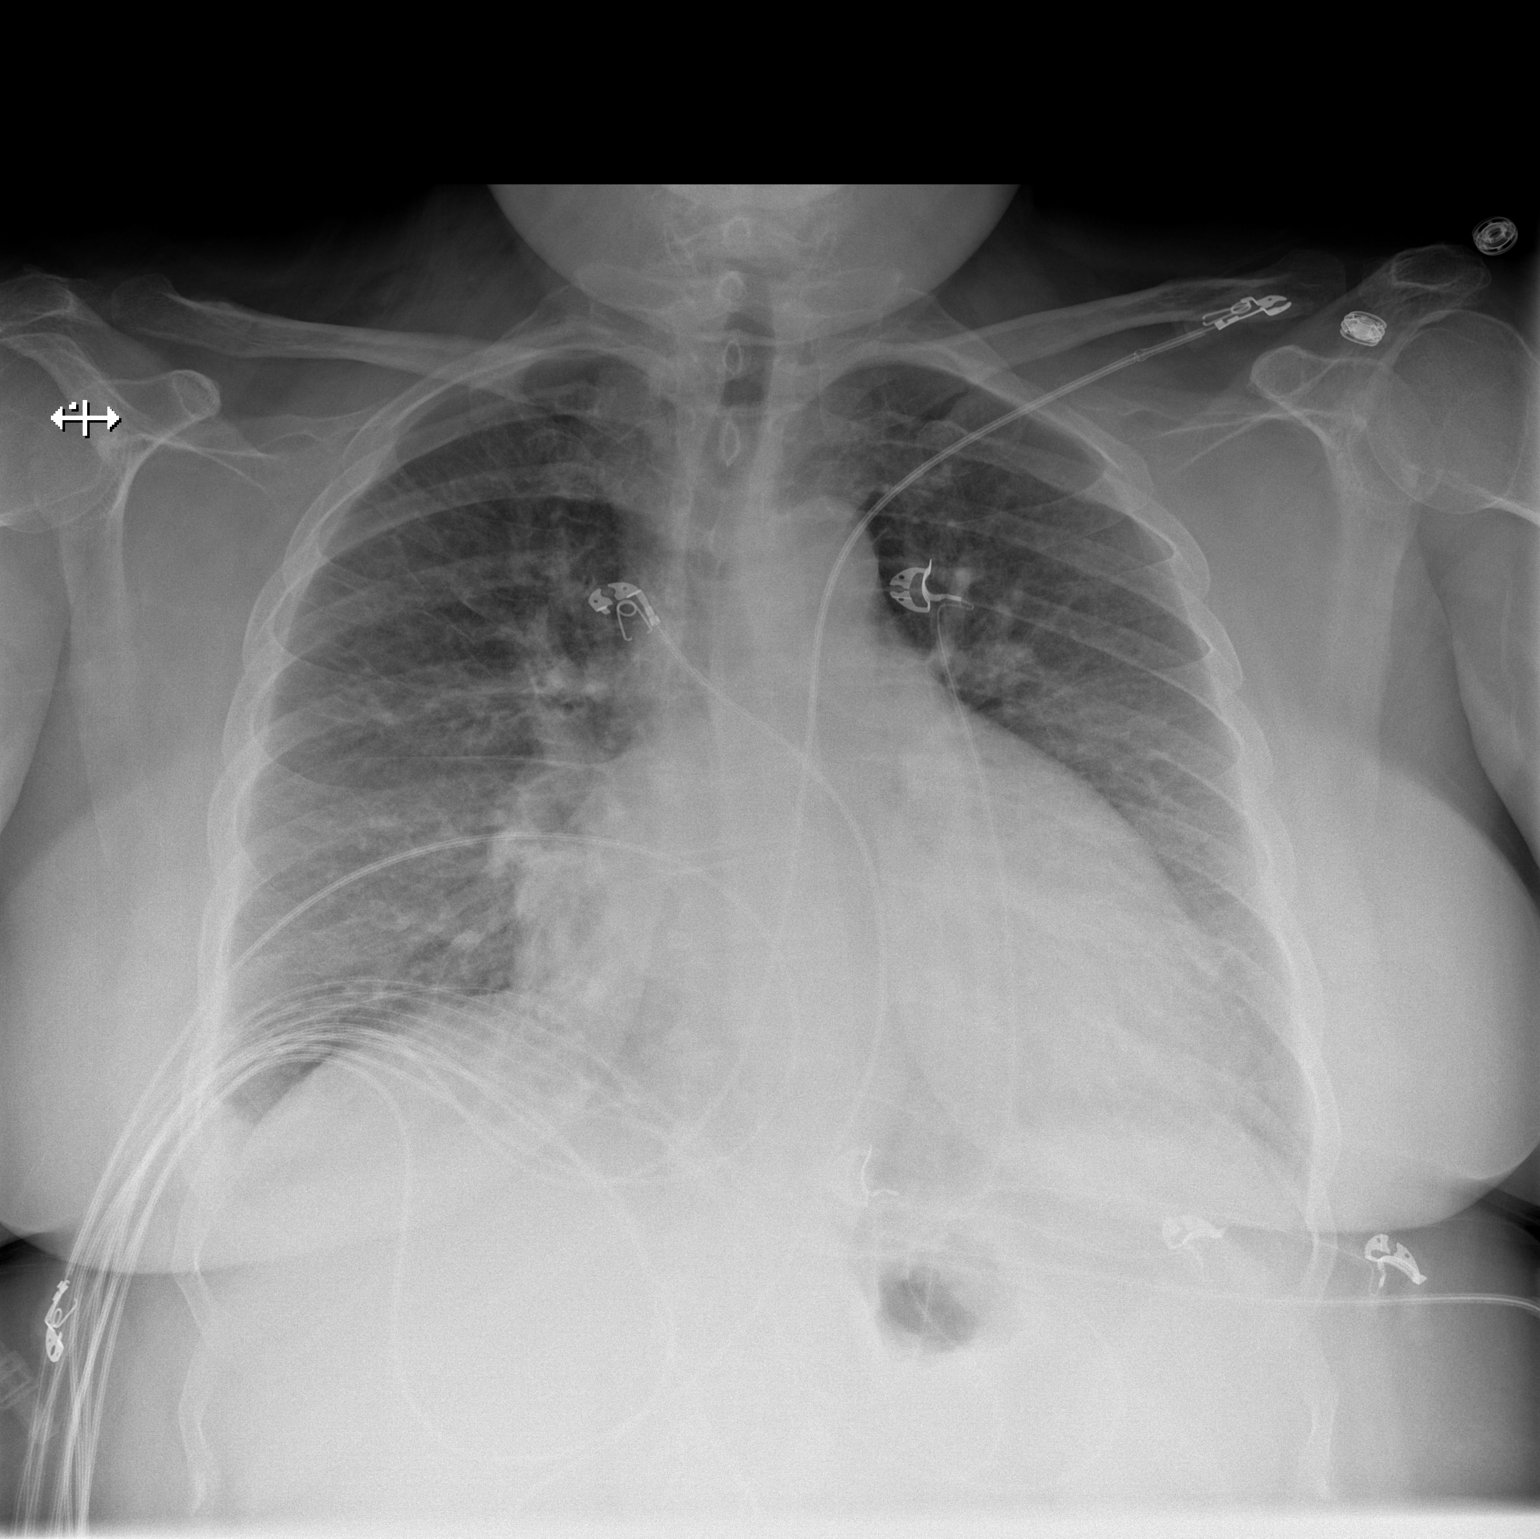

[w chest lat]
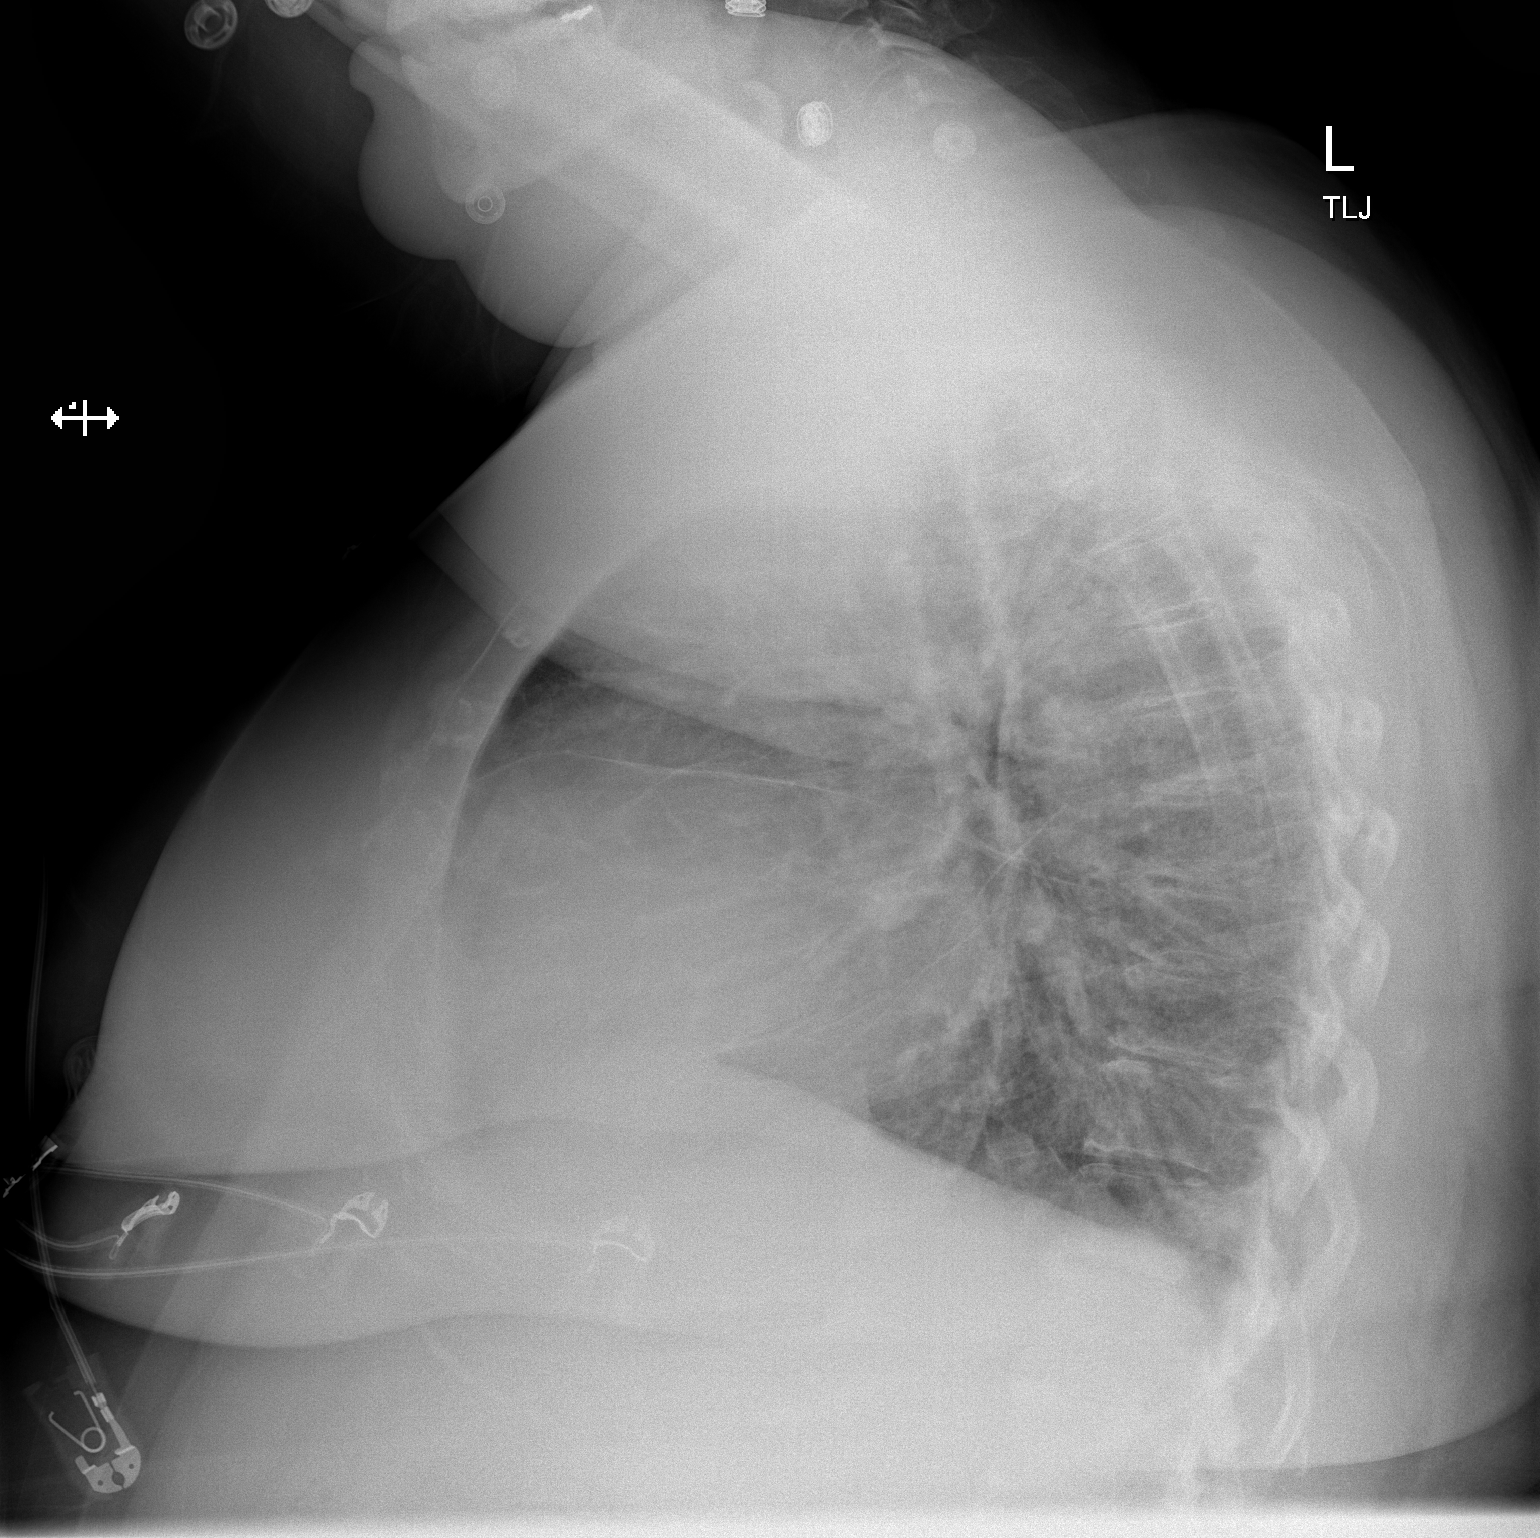

[2 of 2 positions shown; findings below may reference images not displayed]

FINDINGS: Mild enlargement of the cardiac silhouette, corresponding with a
small pericardial effusion on CT. There is a small right pleural
effusion with vascular congestion and possible mild pulmonary edema.
The subpleural airspace disease at the right lateral lung base seen
on earlier abdominal CT is not well visualized. There is no
pneumothorax. The bones appear unremarkable. Telemetry leads overlie
the chest.
IMPRESSION: Enlarged cardiac silhouette, mild pulmonary edema and small right
pleural effusion, suggesting possible mild congestive heart failure.

No focal airspace disease identified radiographically. See earlier
abdominal CT report.

## 2022-06-25 MED ORDER — ALBUTEROL SULFATE HFA 108 (90 BASE) MCG/ACT IN AERS
2.0000 | INHALATION_SPRAY | RESPIRATORY_TRACT | 3 refills | Status: AC | PRN
  Filled 2022-06-25: qty 6.7, 17d supply, fill #0
  Filled 2022-09-26 (×2): qty 6.7, 17d supply, fill #1
  Filled 2022-11-28 (×2): qty 6.7, 17d supply, fill #2
  Filled 2023-01-28: qty 6.7, 17d supply, fill #3

## 2022-06-26 ENCOUNTER — Other Ambulatory Visit: Payer: Self-pay

## 2022-06-26 IMAGING — CT CT ANGIO CHEST
2 of 7 series · 17 of 46 positions shown · IV contrast (OMNIPAQUE)
Comparison: Chest two views 12/27/2021 and 10/12/2020; CT abdomen
and pelvis 12/27/2021; CTA chest 08/16/2020

CLINICAL DATA: Pulmonary embolism suspected.  High probability.

EXAM:
CT ANGIOGRAPHY CHEST WITH CONTRAST
TECHNIQUE: Multidetector CT imaging of the chest was performed using the
standard protocol during bolus administration of intravenous
contrast. Multiplanar CT image reconstructions and MIPs were
obtained to evaluate the vascular anatomy.

[Series 5: thins · axial · 0.68mm/px · z∈[+1484,+1762]mm · 15 of 314 slices shown]
[im 18/314  lung]
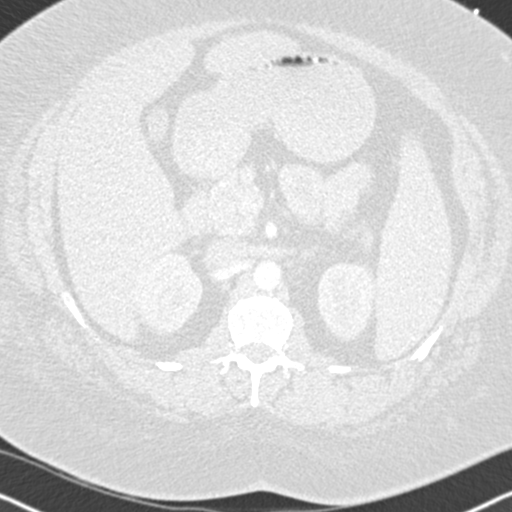
[im 35/314  soft-tissue]
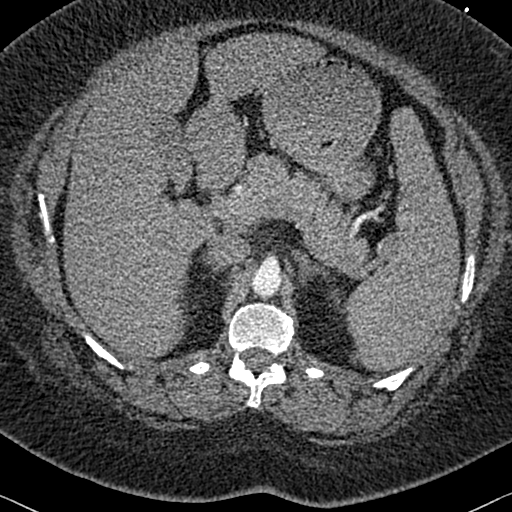
[im 53/314  lung]
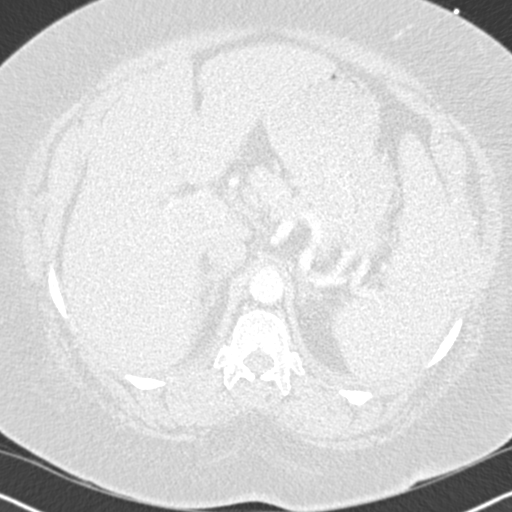
[im 70/314  soft-tissue]
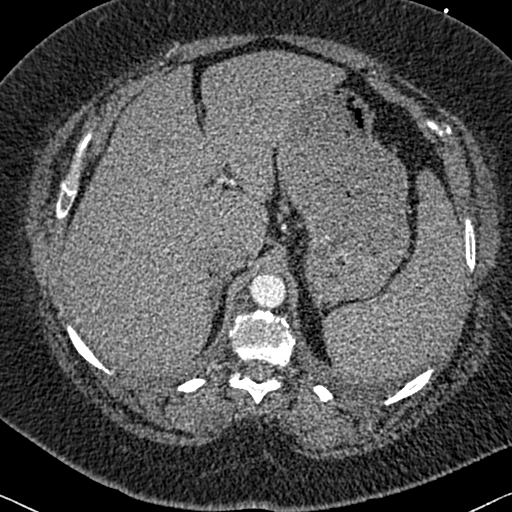
[im 105/314  lung]
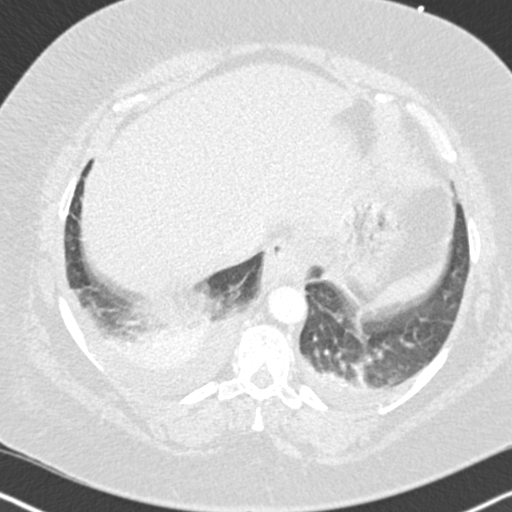
[im 122/314  soft-tissue]
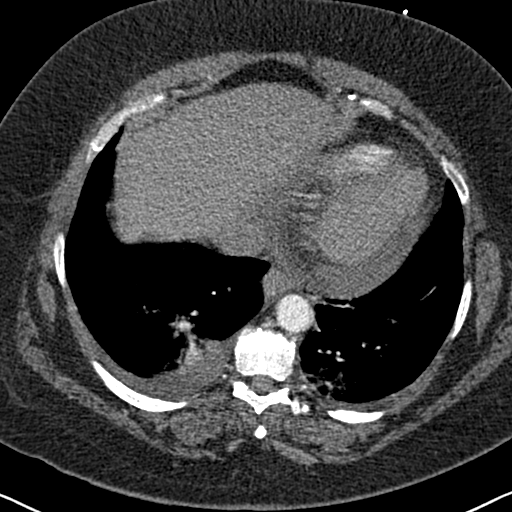
[im 140/314  lung]
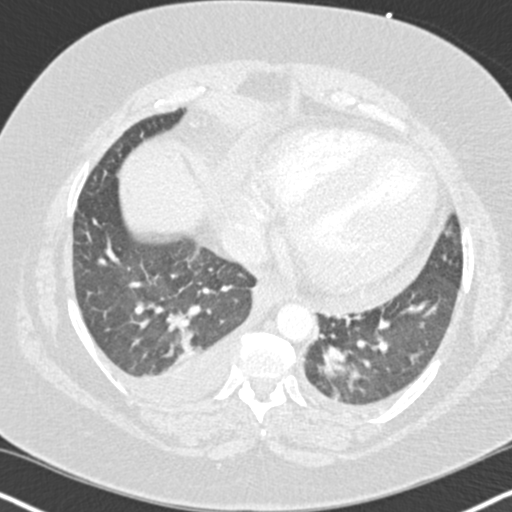
[im 157/314  soft-tissue]
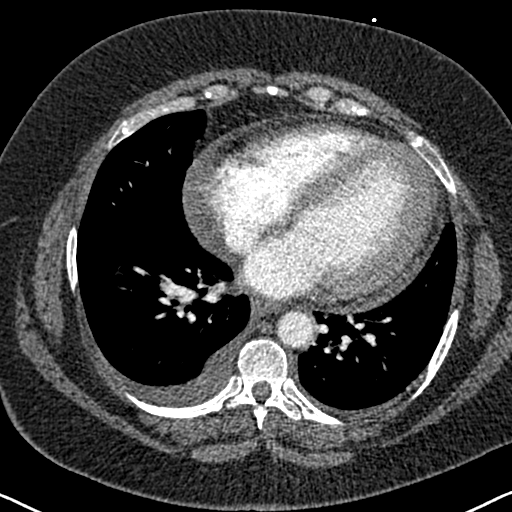
[im 174/314  lung]
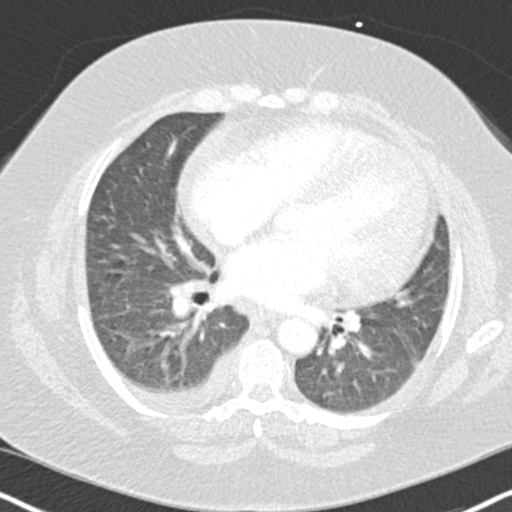
[im 192/314  soft-tissue]
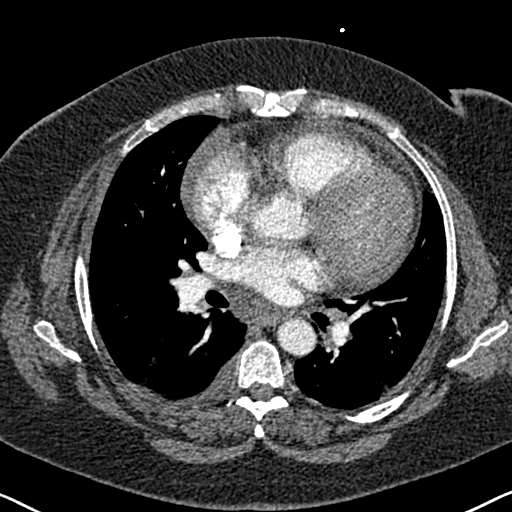
[im 209/314  lung]
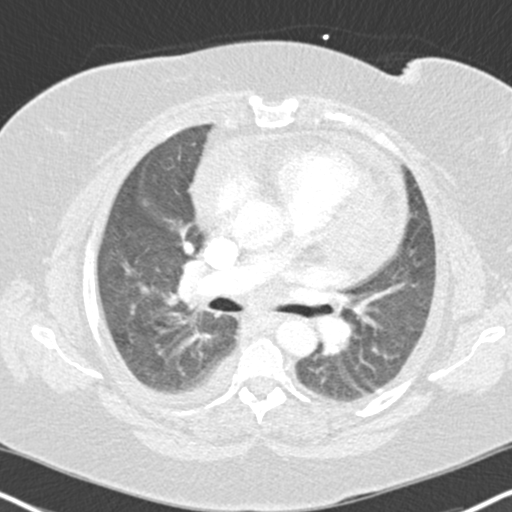
[im 244/314  soft-tissue]
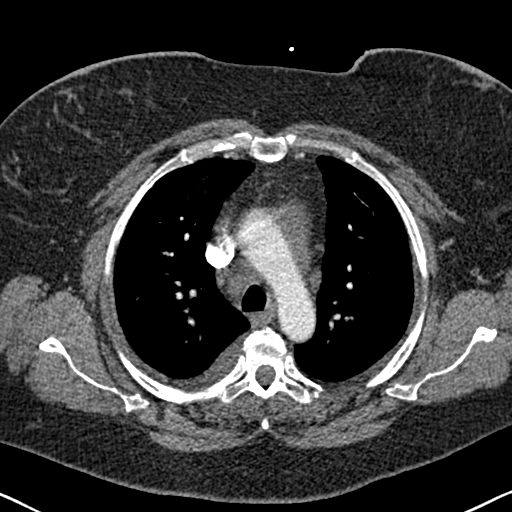
[im 261/314  lung]
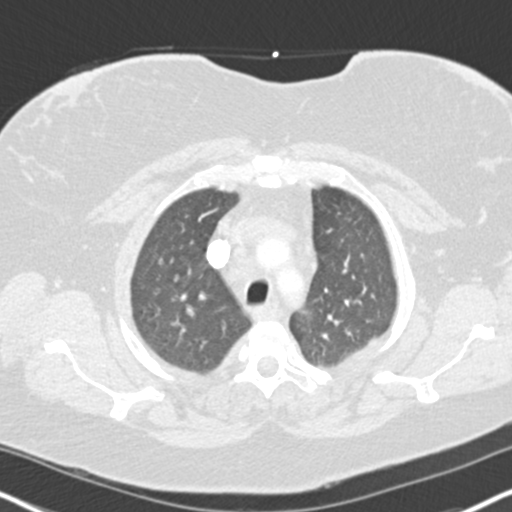
[im 279/314  soft-tissue]
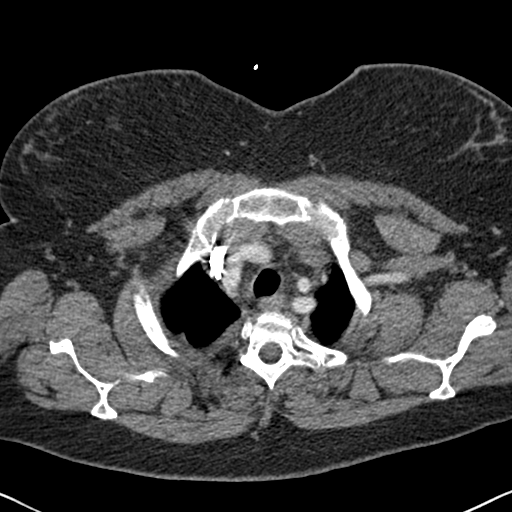
[im 296/314  lung]
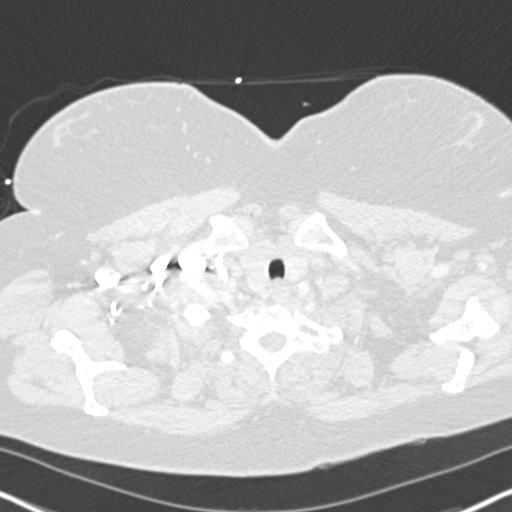

[Series 7: coronal mpr · coronal · 0.71mm/px · 2 of 98 slices shown]
[im 33/98  soft-tissue]
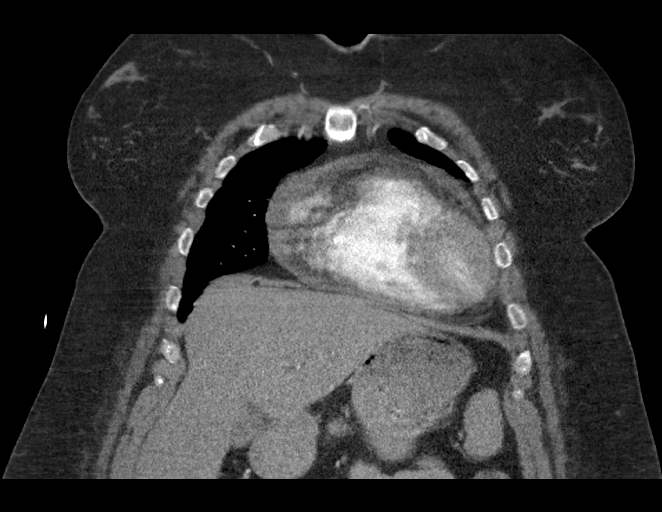
[im 65/98  soft-tissue]
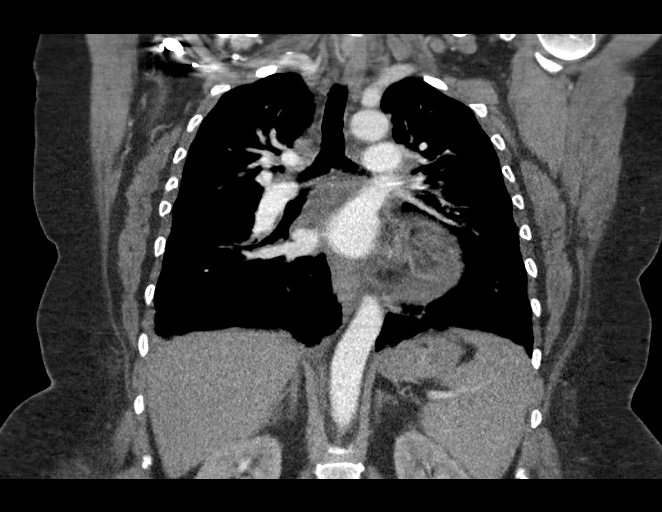

[17 of 46 positions shown; findings below may reference images not displayed]

RADIATION DOSE REDUCTION: This exam was performed according to the
departmental dose-optimization program which includes automated
exposure control, adjustment of the mA and/or kV according to
patient size and/or use of iterative reconstruction technique.

CONTRAST:  100mL OMNIPAQUE IOHEXOL 350 MG/ML SOLN
FINDINGS: Cardiovascular: The main pulmonary artery is opacified up to 206
Hounsfield units. This is a suboptimal contrast bolus timing. No
filling defect is seen within the main left main right main, or
lobar pulmonary arteries. Evaluation of the more distal pulmonary
arteries is limited by contrast bolus timing. No definitive
pulmonary embolism is seen.

Heart size is mild-to-moderately enlarged. A small pericardial
effusion is similar to 08/16/2020 prior CT. No thoracic aortic
aneurysm.

Mediastinum/Nodes: No axillary, mediastinal, or hilar pathologically
enlarged lymph nodes by CT criteria. The visualized thyroid is
unremarkable. The esophagus follows a normal course of normal
caliber.

Lungs/Pleura: The central airways are patent. Compared to 12/27/2021
CT abdomen and pelvis, there is no significant change in
mild-to-moderate right and mild left pleural effusions. The prior
lateral right costophrenic angle airspace opacity appears to no
longer be present in the same location, likely previously
atelectasis. There are patchy bilateral ground-glass opacities
likely representing the mild interstitial pulmonary edema seen on
prior radiographs. There is bilateral lower lung posterior dependent
subsegmental atelectasis.

Upper Abdomen: The spleen measures 18 cm in AP dimension, again
markedly enlarged.

Musculoskeletal: Mild-to-moderate multilevel degenerative disc
changes of the thoracic spine.

Review of the MIP images confirms the above findings.
IMPRESSION: :
IMPRESSION: 1. No large central pulmonary embolism is seen. Evaluation of the
segmental and more distal pulmonary arteries is limited by contrast
bolus timing.
2. Mild-to-moderate cardiomegaly, unchanged. Mild pericardial
effusion is unchanged from 08/16/2020.
3. Mild-to-moderate right and mild left pleural effusions with
posterior dependent subsegmental atelectasis.
4. Mild pulmonary edema.

## 2022-07-04 ENCOUNTER — Other Ambulatory Visit: Payer: Self-pay

## 2022-07-06 ENCOUNTER — Encounter: Payer: Self-pay | Admitting: Pharmacist

## 2022-07-06 ENCOUNTER — Ambulatory Visit: Payer: Self-pay | Attending: Internal Medicine | Admitting: Pharmacist

## 2022-07-06 ENCOUNTER — Other Ambulatory Visit: Payer: Self-pay

## 2022-07-06 DIAGNOSIS — N1832 Chronic kidney disease, stage 3b: Secondary | ICD-10-CM

## 2022-07-06 NOTE — Progress Notes (Signed)
S:     No chief complaint on file.  46 y.o. female who presents for hypertension evaluation, education, and management.  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 Barnett Applebaum CV),  CKD 3b, HL(decline statin), migraines, asthma, MDD/anxiety, Anemia.  Patient was referred and last seen by Primary Care Provider, Dr. Wynetta Emery, on 05/29/2022.   At last visit, BP was above goal. Labs revealed K of 5.4 on spironolactone '50mg'$  daily. This was decreased to '25mg'$  daily. Because BP was still above goal, Dr. Wynetta Emery added isosorbide. Pt tells me today that she is adherent to her medications. The isosorbide is causing daily headaches and she wonders if she can stop this.   Today, patient arrives in spirits and presents without assistance. Denies dizziness, blurred vision, swelling.   Patient reports hypertension is longstanding.   Family/Social history:  Fhx: HTN, DM Tobacco: never smoker  Alcohol: none reported   Medication adherence reported. Patient has taken BP medications today.   Current antihypertensives include: amlodipine 10 mg daily, carvedilol 25 mg BID, hydralazine 50 mg TID (cannot tolerate higher doses), losartan 75 mg daily, spironolactone (recently reduced from '50mg'$  daily d/t hyperkalemia) **Also taking furosemide for fluid control, isosorbide for BP given limited alternatives   Reported home BP readings:  -Took this morning and was 119/78 mmHg  Patient reported dietary habits: -Restricts sodium intake -Does not drink excessive caffeine   Patient-reported exercise habits: none reported   O:  Vitals:   07/06/22 1329  BP: 115/81  Pulse: (!) 101    Last 3 Office BP readings: BP Readings from Last 3 Encounters:  07/06/22 115/81  05/29/22 (!) 134/94  01/16/22 (!) 140/95    BMET    Component Value Date/Time   NA 144 05/31/2022 1353   K 5.4 (H) 05/31/2022 1353   CL 103  05/31/2022 1353   CO2 23 05/31/2022 1353   GLUCOSE 133 (H) 05/31/2022 1353   GLUCOSE 101 (H) 12/30/2021 0251   BUN 10 05/31/2022 1353   CREATININE 1.47 (H) 05/31/2022 1353   CALCIUM 9.0 05/31/2022 1353   GFRNONAA 46 (L) 12/30/2021 0251    Renal function: CrCl cannot be calculated (Patient's most recent lab result is older than the maximum 21 days allowed.).  Clinical ASCVD: No  The 10-year ASCVD risk score (Arnett DK, et al., 2019) is: 4.5%   Values used to calculate the score:     Age: 34 years     Sex: Female     Is Non-Hispanic African American: No     Diabetic: No     Tobacco smoker: Yes     Systolic Blood Pressure: 440 mmHg     Is BP treated: Yes     HDL Cholesterol: 46 mg/dL     Total Cholesterol: 216 mg/dL  A/P: Hypertension diagnosed currently at goal on current medications. BP goal < 130/80 mmHg. Medication adherence appears appropriate. She seems to be on GDMT for the most part. Of note, she tells me today that she has not tried any other ARBs. With her CHF we could consider changing to an ARNI in the future. She cannot tolerate the isosorbide. I told her to hold this for now. I will reach out to her PCP. If her labs are stable and hyperkalemia is resolved, we could at least consider changing to valsartan. She is self pay, so getting patient assistance with Delene Loll may take time.  -Continue current regimen. -  Hold isosorbide for now given side effects.   -Patient educated on purpose, proper use, and potential adverse effects of valsartan, Entresto as future options.  -F/u labs ordered - BMP per PCP. -Counseled on lifestyle modifications for blood pressure control including reduced dietary sodium, increased exercise, adequate sleep. -Encouraged patient to check BP at home and bring log of readings to next visit. Counseled on proper use of home BP cuff.    Results reviewed and written information provided.    Written patient instructions provided. Patient verbalized  understanding of treatment plan.  Total time in face to face counseling 30 minutes.    Follow-up:  Pharmacist prn. PCP clinic visit in Jan.   Benard Halsted, PharmD, [place], [place] [phone]

## 2022-07-07 LAB — BASIC METABOLIC PANEL
BUN/Creatinine Ratio: 12 (ref 9–23)
BUN: 19 mg/dL (ref 6–24)
CO2: 22 mmol/L (ref 20–29)
Calcium: 9.7 mg/dL (ref 8.7–10.2)
Chloride: 99 mmol/L (ref 96–106)
Creatinine, Ser: 1.54 mg/dL — ABNORMAL HIGH (ref 0.57–1.00)
Glucose: 111 mg/dL — ABNORMAL HIGH (ref 70–99)
Potassium: 4.8 mmol/L (ref 3.5–5.2)
Sodium: 137 mmol/L (ref 134–144)
eGFR: 42 mL/min/{1.73_m2} — ABNORMAL LOW (ref >59)

## 2022-07-09 ENCOUNTER — Telehealth: Payer: Self-pay | Admitting: Pharmacist

## 2022-07-09 NOTE — Telephone Encounter (Signed)
Called patient to discuss the results of her labs from Friday (07/06/2022). Verified I was speaking to the patient using two identifiers. I identified myself and the reason for my call.   Patient's K has normalized since decreasing the dose of her spironolactone from '50mg'$  daily to 25 mg daily. Her renal function is not 100% but stable. She has no questions for me about these results.   Of note, I did discuss the option of changing losartan to valsartan during our visit together on 07/06/22. Her BP was at goal at that visit, but I stopped her isosorbide d/t side effects (HA). I made no other changes given that we were collecting labs that day. She has a longstanding hx of hypertension, and I think she has a degree of white coat. I had time to let her rest Friday for ~5 minutes before checking her BP and the result was 115/81 mmHg. She tells me today that her BP this weekend was 118/80 mmHg and 120/80 mmHg, even after I stopped the isosorbide. For these reasons, I recommend no additional changes at this time.   I discussed this with her and we both agreed to continue her current regimen. If her BP at home becomes elevated between now and her next PCP visit, she will come in for a recheck. Routing information to PCP so she is aware.

## 2022-07-25 ENCOUNTER — Other Ambulatory Visit: Payer: Self-pay

## 2022-07-31 ENCOUNTER — Other Ambulatory Visit: Payer: Self-pay

## 2022-08-28 ENCOUNTER — Other Ambulatory Visit: Payer: Self-pay

## 2022-08-29 ENCOUNTER — Other Ambulatory Visit: Payer: Self-pay

## 2022-08-30 ENCOUNTER — Ambulatory Visit: Payer: Self-pay | Admitting: Internal Medicine

## 2022-09-26 ENCOUNTER — Other Ambulatory Visit: Payer: Self-pay

## 2022-10-26 ENCOUNTER — Other Ambulatory Visit: Payer: Self-pay

## 2022-10-29 ENCOUNTER — Other Ambulatory Visit: Payer: Self-pay

## 2022-11-05 ENCOUNTER — Other Ambulatory Visit: Payer: Self-pay

## 2022-11-06 ENCOUNTER — Encounter: Payer: Self-pay | Admitting: Internal Medicine

## 2022-11-06 ENCOUNTER — Ambulatory Visit: Payer: Self-pay | Attending: Internal Medicine | Admitting: Internal Medicine

## 2022-11-06 ENCOUNTER — Other Ambulatory Visit: Payer: Self-pay

## 2022-11-06 VITALS — BP 118/83 | HR 96 | Temp 98.0°F | Ht 62.0 in | Wt 260.0 lb

## 2022-11-06 DIAGNOSIS — N1832 Chronic kidney disease, stage 3b: Secondary | ICD-10-CM

## 2022-11-06 DIAGNOSIS — Z5320 Procedure and treatment not carried out because of patient's decision for unspecified reasons: Secondary | ICD-10-CM

## 2022-11-06 DIAGNOSIS — I5042 Chronic combined systolic (congestive) and diastolic (congestive) heart failure: Secondary | ICD-10-CM

## 2022-11-06 DIAGNOSIS — Z1211 Encounter for screening for malignant neoplasm of colon: Secondary | ICD-10-CM

## 2022-11-06 DIAGNOSIS — R7303 Prediabetes: Secondary | ICD-10-CM

## 2022-11-06 DIAGNOSIS — I10 Essential (primary) hypertension: Secondary | ICD-10-CM

## 2022-11-06 MED ORDER — SPIRONOLACTONE 25 MG PO TABS
25.0000 mg | ORAL_TABLET | Freq: Every morning | ORAL | 5 refills | Status: AC
  Filled 2022-11-06 – 2022-11-28 (×2): qty 30, 30d supply, fill #0
  Filled 2022-12-29 – 2022-12-31 (×2): qty 30, 30d supply, fill #1
  Filled 2023-01-28 (×2): qty 30, 30d supply, fill #2
  Filled 2023-03-02: qty 30, 30d supply, fill #3
  Filled 2023-03-11: qty 30, 30d supply, fill #4
  Filled ????-??-??: fill #4

## 2022-11-06 MED ORDER — LOSARTAN POTASSIUM 50 MG PO TABS
50.0000 mg | ORAL_TABLET | Freq: Every evening | ORAL | 1 refills | Status: AC
  Filled 2022-11-06: qty 90, 90d supply, fill #0
  Filled 2022-11-28: qty 30, 30d supply, fill #0
  Filled 2022-12-29 – 2022-12-31 (×2): qty 30, 30d supply, fill #1
  Filled 2023-01-28 (×2): qty 30, 30d supply, fill #2
  Filled 2023-03-02: qty 30, 30d supply, fill #3
  Filled 2023-03-11: qty 30, 30d supply, fill #4
  Filled ????-??-??: fill #4

## 2022-11-06 MED ORDER — FUROSEMIDE 40 MG PO TABS
40.0000 mg | ORAL_TABLET | Freq: Every day | ORAL | 1 refills | Status: AC
  Filled 2022-11-28: qty 30, 30d supply, fill #0
  Filled 2022-12-29 – 2022-12-31 (×2): qty 30, 30d supply, fill #1
  Filled 2023-01-28: qty 30, 30d supply, fill #2
  Filled 2023-03-02: qty 30, 30d supply, fill #3
  Filled 2023-03-11: qty 60, 60d supply, fill #4
  Filled ????-??-??: fill #4

## 2022-11-06 MED ORDER — CARVEDILOL 25 MG PO TABS
25.0000 mg | ORAL_TABLET | Freq: Two times a day (BID) | ORAL | 1 refills | Status: AC
  Filled 2022-11-06: qty 180, 90d supply, fill #0
  Filled 2022-11-28: qty 60, 30d supply, fill #0
  Filled 2022-12-29 – 2022-12-31 (×2): qty 60, 30d supply, fill #1
  Filled 2023-01-28: qty 60, 30d supply, fill #2
  Filled 2023-03-02: qty 60, 30d supply, fill #3
  Filled 2023-03-11: qty 60, 30d supply, fill #4
  Filled ????-??-??: fill #4

## 2022-11-06 NOTE — Progress Notes (Signed)
Patient ID: Rebecca Donovan, female    DOB: 1976/06/15  MRN: [id number]  CC: Hypertension (HTN f/u. )   Subjective: Rebecca Donovan is a 47 y.o. 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 Barnett Applebaum CV),  CKD 3b, HL(decline statin), migraines, asthma, MDD/anxiety, Anemia   HTN/CM: On last visit with me, potassium level was found to be elevated.  We had her decrease spironolactone to 25 mg daily, start taking furosemide daily and added isosorbide.  Subsequently saw the clinical pharmacist and isosorbide was stopped due to headaches. Current medications are amlodipine 10 mg daily, carvedilol 25 mg twice a day, furosemide 40 mg daily, hydralazine 50 mg 3 times a day and Cozaar 75 mg daily.  Reports compliance with taking the medications. Checking BP at home daily.  Gives range 116-120/80-90 Limiting salt and cooks No CP/SOB/LE edema  HL: never took Lipitor prescribed by her cardiologist.  Does not want to be on statin due to possible S.E.  Last saw him about 1 yr  Obesity/PreDM:  Lab Results  Component Value Date   HGBA1C 6.0 (H) 05/31/2022   walks daily 7-10000 steps a day Doing well with eating habits - no pork or beef.  Has cut back on meat in general.  Eating more fruits and veggies.  Does her own cooking.  Eat out occasionally.  CKD: GFR has remained in the 40s.  Not on NSAIDs.  Patient Active Problem List   Diagnosis Date Noted   Prediabetes 06/06/2022   Stage 3b chronic kidney disease (CKD) ([place]) 12/30/2021   Essential hypertension 04/06/2021   Acute on chronic combined systolic and diastolic CHF (congestive heart failure) ([place]) 04/06/2021   Insomnia due to other mental disorder 04/06/2021   Major depressive disorder, single episode, moderate ([place]) 04/06/2021   GAD (generalized anxiety disorder) 04/06/2021    Cardiomyopathy ([place])    Pericardial effusion    Morbid obesity ([place])      Current Outpatient Medications on File Prior to Visit  Medication Sig Dispense Refill   albuterol (PROVENTIL HFA) 108 (90 Base) MCG/ACT inhaler Inhale 2 puffs into the lungs every 4 (four) hours as needed for wheezing or shortness of breath. 6.7 g 3   amLODipine (NORVASC) 10 MG tablet Take 1 tablet (10 mg total) by mouth daily. 30 tablet 6   Ascorbic Acid (VITAMIN C PO) Take 1,500 mg by mouth daily as needed (when feeling sick).     carvedilol (COREG) 25 MG tablet Take 1 tablet (25 mg total) by mouth 2 (two) times daily with a meal. 180 tablet 1   fluticasone (FLONASE) 50 MCG/ACT nasal spray Place 1 spray into both nostrils daily. 16 g 2   furosemide (LASIX) 40 MG tablet Take 1 tablet (40 mg total) by mouth daily. 90 tablet 1   hydrALAZINE (APRESOLINE) 100 MG tablet Take 0.5 tablets (50 mg total) by mouth every 8 (eight) hours. 45 tablet 6   losartan (COZAAR) 25 MG tablet Take 1 tablet (25 mg total) by mouth daily. Take 1 tab daily with the 50 mg tab for a total of 75 mg daily total. 30 tablet 6   losartan (COZAAR) 50 MG tablet Take 1 tablet (50 mg total) by mouth every evening. 90 tablet 1   OVER THE COUNTER MEDICATION Take 1 tablet by mouth daily. Multivitamin     spironolactone (ALDACTONE)  25 MG tablet Take 1 tablet (25 mg total) by mouth every morning. 30 tablet 5   acetaminophen (TYLENOL) 500 MG tablet Take 1,000 mg by mouth daily as needed (pain). (Patient not taking: Reported on 11/06/2022)     aspirin-acetaminophen-caffeine (EXCEDRIN MIGRAINE) 250-250-65 MG tablet Take 2 tablets by mouth daily as needed (severe headache). (Patient not taking: Reported on 11/06/2022)     atorvastatin (LIPITOR) 40 MG tablet Take 1 tablet (40 mg total) by mouth at bedtime. (Patient not taking: Reported on 11/06/2022) 30 tablet 6   Coenzyme Q10 (CO Q 10 PO) Take 2 tablets by mouth daily. (Patient not taking: Reported on 11/06/2022)      Homeopathic Products ([facility]) CREA Apply 1 application topically daily as needed (bruises). (Patient not taking: Reported on 11/06/2022)     No current facility-administered medications on file prior to visit.    No Known Allergies  Social History   Socioeconomic History   Marital status: Divorced    Spouse name: Not on file   Number of children: Not on file   Years of education: Not on file   Highest education level: Not on file  Occupational History   Not on file  Tobacco Use   Smoking status: Never   Smokeless tobacco: Never  Vaping Use   Vaping Use: Never used  Substance and Sexual Activity   Alcohol use: Never   Drug use: Never   Sexual activity: Not on file  Other Topics Concern   Not on file  Social History Narrative   Not on file   Social Determinants of Health   Financial Resource Strain: Not on file  Food Insecurity: Not on file  Transportation Needs: Not on file  Physical Activity: Not on file  Stress: Not on file  Social Connections: Not on file  Intimate Partner Violence: Not on file    Family History  Problem Relation Age of Onset   Hypertension Mother    Diabetes Maternal Grandfather    Cancer Paternal Grandfather     Past Surgical History:  Procedure Laterality Date   CERVIX SURGERY      ROS: Review of Systems Negative except as stated above  PHYSICAL EXAM: BP 118/83 (BP Location: Left Arm, Patient Position: Sitting, Cuff Size: Large)   Pulse 96   Temp 98 F (36.7 C) (Oral)   Ht 5\' 2"  (1.575 m)   Wt 260 lb (117.9 kg)   SpO2 98%   BMI 47.55 kg/m   Wt Readings from Last 3 Encounters:  11/06/22 260 lb (117.9 kg)  05/29/22 264 lb 6.4 oz (119.9 kg)  01/16/22 276 lb 4.8 oz (125.3 kg)    Physical Exam  General appearance - alert, well appearing, middle-age Caucasian female and in no distress Mental status - normal mood, behavior, speech, dress, motor activity, and thought processes Neck - supple, no significant  adenopathy Chest - clear to auscultation, no wheezes, rales or rhonchi, symmetric air entry Heart - normal rate, regular rhythm, normal S1, S2, no murmurs, rubs, clicks or gallops Extremities - peripheral pulses normal, no pedal edema, no clubbing or cyanosis      Latest Ref Rng & Units 07/06/2022   10:03 AM 05/31/2022    1:53 PM 12/30/2021    2:51 AM  CMP  Glucose 70 - 99 mg/dL 111  133  101   BUN 6 - 24 mg/dL 19  10  23    Creatinine 0.57 - 1.00 mg/dL 1.54  1.47  1.42  Sodium 134 - 144 mmol/L 137  144  139   Potassium 3.5 - 5.2 mmol/L 4.8  5.4  3.9   Chloride 96 - 106 mmol/L 99  103  103   CO2 20 - 29 mmol/L 22  23  26    Calcium 8.7 - 10.2 mg/dL 9.7  9.0  8.7    Lipid Panel     Component Value Date/Time   CHOL 216 (H) 08/18/2020 0421   TRIG 168 (H) 08/18/2020 0421   HDL 46 08/18/2020 0421   CHOLHDL 4.7 08/18/2020 0421   VLDL 34 08/18/2020 0421   LDLCALC 136 (H) 08/18/2020 0421    CBC    Component Value Date/Time   WBC 8.8 05/31/2022 1353   WBC 6.4 12/29/2021 0215   RBC 4.54 05/31/2022 1353   RBC 4.05 12/29/2021 0215   HGB 13.2 05/31/2022 1353   HCT 40.4 05/31/2022 1353   PLT 225 05/31/2022 1353   MCV 89 05/31/2022 1353   MCH 29.1 05/31/2022 1353   MCH 26.9 12/29/2021 0215   MCHC 32.7 05/31/2022 1353   MCHC 32.0 12/29/2021 0215   RDW 14.2 05/31/2022 1353   LYMPHSABS 1.5 12/27/2021 0903   MONOABS 0.7 12/27/2021 0903   EOSABS 0.3 12/27/2021 0903   BASOSABS 0.1 12/27/2021 0903    ASSESSMENT AND PLAN: 1. Essential hypertension Close to goal.  Continue current medications listed above. - carvedilol (COREG) 25 MG tablet; Take 1 tablet (25 mg total) by mouth 2 (two) times daily with a meal.  Dispense: 180 tablet; Refill: 1 - furosemide (LASIX) 40 MG tablet; Take 1 tablet (40 mg total) by mouth daily.  Dispense: 90 tablet; Refill: 1 - losartan (COZAAR) 50 MG tablet; Take 1 tablet (50 mg total) by mouth every evening. Take with the 25 mg tablet.  Dispense: 90 tablet;  Refill: 1 - spironolactone (ALDACTONE) 25 MG tablet; Take 1 tablet (25 mg total) by mouth every morning.  Dispense: 30 tablet; Refill: 5 - Basic Metabolic Panel; Future  2. Chronic combined systolic and diastolic congestive heart failure (HCC) Compensated.  Continue carvedilol, hydralazine, furosemide, spironolactone and Cozaar.  3. Stage 3b chronic kidney disease ([place]) We will continue to monitor.  Avoid NSAIDs.  4. Morbid obesity ([place]) Commended her on 16 pounds weight loss over the past year.  Encouraged her to continue healthy eating habits and moving as much as she can.  5. Prediabetes See #4 above.  6. Screening for colon cancer - Fecal occult blood, imunochemical([facility])  7. Statin declined     Patient was given the opportunity to ask questions.  Patient verbalized understanding of the plan and was able to repeat key elements of the plan.   This documentation was completed using Radio producer.  Any transcriptional errors are unintentional.  No orders of the defined types were placed in this encounter.    Requested Prescriptions    No prescriptions requested or ordered in this encounter    No follow-ups on file.  Karle Plumber, MD, FACP

## 2022-11-28 ENCOUNTER — Other Ambulatory Visit: Payer: Self-pay

## 2022-12-29 ENCOUNTER — Other Ambulatory Visit: Payer: Self-pay | Admitting: Internal Medicine

## 2022-12-29 DIAGNOSIS — I10 Essential (primary) hypertension: Secondary | ICD-10-CM

## 2022-12-31 ENCOUNTER — Other Ambulatory Visit: Payer: Self-pay

## 2022-12-31 MED ORDER — AMLODIPINE BESYLATE 10 MG PO TABS
10.0000 mg | ORAL_TABLET | Freq: Every day | ORAL | 1 refills | Status: AC
  Filled 2022-12-31: qty 30, 30d supply, fill #0
  Filled 2023-01-28: qty 30, 30d supply, fill #1
  Filled 2023-03-02: qty 30, 30d supply, fill #2
  Filled 2023-03-11: qty 30, 30d supply, fill #3
  Filled ????-??-??: fill #3

## 2022-12-31 MED ORDER — LOSARTAN POTASSIUM 25 MG PO TABS
25.0000 mg | ORAL_TABLET | Freq: Every day | ORAL | 1 refills | Status: AC
  Filled 2022-12-31: qty 30, 30d supply, fill #0
  Filled 2023-01-28 (×2): qty 30, 30d supply, fill #1
  Filled 2023-03-02: qty 30, 30d supply, fill #2
  Filled 2023-03-11: qty 30, 30d supply, fill #3
  Filled ????-??-??: fill #3

## 2022-12-31 NOTE — Telephone Encounter (Signed)
Requested Prescriptions  Pending Prescriptions Disp Refills   amLODipine (NORVASC) 10 MG tablet 90 tablet 1    Sig: Take 1 tablet (10 mg total) by mouth daily.     Cardiovascular: Calcium Channel Blockers 2 Passed - 12/29/2022  8:23 AM      Passed - Last BP in normal range    BP Readings from Last 1 Encounters:  11/06/22 118/83         Passed - Last Heart Rate in normal range    Pulse Readings from Last 1 Encounters:  11/06/22 96         Passed - Valid encounter within last 6 months    Recent Outpatient Visits           1 month ago Essential hypertension   [facility] Jonah Blue B, MD   5 months ago Stage 3b chronic kidney disease [facility])   [facility] [place], [place] L, RPH-CPP   7 months ago Pap smear for cervical cancer screening   [place] [facility] Marcine Matar, MD   11 months ago Hospital discharge follow-up   [facility] Marcine Matar, MD   1 year ago Encounter to establish care   [facility] Marcine Matar, MD       Future Appointments             In 2 months Marcine Matar, MD [facility]             losartan (COZAAR) 25 MG tablet 90 tablet 1    Sig: Take 1 tablet (25 mg total) by mouth daily. Take 1 tab daily with the 50 mg tab for a total of 75 mg daily total.     Cardiovascular:  Angiotensin Receptor Blockers Failed - 12/29/2022  8:23 AM      Failed - Cr in normal range and within 180 days    Creatinine, Ser  Date Value Ref Range Status  07/06/2022 1.54 (H) 0.57 - 1.00 mg/dL Final         Passed - K in normal range and within 180 days    Potassium  Date Value Ref Range Status  07/06/2022 4.8 3.5 - 5.2 mmol/L Final         Passed - Patient is not pregnant      Passed - Last BP in normal range    BP Readings from Last  1 Encounters:  11/06/22 118/83         Passed - Valid encounter within last 6 months    Recent Outpatient Visits           1 month ago Essential hypertension   [facility] Marcine Matar, MD   5 months ago Stage 3b chronic kidney disease [facility])   [facility] [place], [place] L, RPH-CPP   7 months ago Pap smear for cervical cancer screening   [facility] Marcine Matar, MD   11 months ago Hospital discharge follow-up   [facility] Marcine Matar, MD   1 year ago Encounter to establish care   [facility] Marcine Matar, MD       Future Appointments  In 2 months Ladell Pier, MD [place]

## 2023-01-28 ENCOUNTER — Other Ambulatory Visit: Payer: Self-pay

## 2023-03-02 ENCOUNTER — Other Ambulatory Visit: Payer: Self-pay

## 2023-03-04 ENCOUNTER — Other Ambulatory Visit: Payer: Self-pay

## 2023-03-05 ENCOUNTER — Other Ambulatory Visit: Payer: Self-pay

## 2023-03-11 ENCOUNTER — Other Ambulatory Visit: Payer: Self-pay

## 2023-03-11 ENCOUNTER — Ambulatory Visit: Payer: Self-pay | Attending: Internal Medicine | Admitting: Internal Medicine

## 2023-03-12 ENCOUNTER — Other Ambulatory Visit: Payer: Self-pay
